# Patient Record
Sex: Male | Born: 1950 | Race: White | Hispanic: No | Marital: Married | Smoking: Never smoker
Health system: Southern US, Community
[De-identification: ages and names within clinical notes are randomized; demographics above are authoritative.]

## PROBLEM LIST (undated history)

## (undated) DIAGNOSIS — K311 Adult hypertrophic pyloric stenosis: Secondary | ICD-10-CM

## (undated) DIAGNOSIS — Z8719 Personal history of other diseases of the digestive system: Secondary | ICD-10-CM

## (undated) DIAGNOSIS — G43909 Migraine, unspecified, not intractable, without status migrainosus: Secondary | ICD-10-CM

## (undated) DIAGNOSIS — K219 Gastro-esophageal reflux disease without esophagitis: Secondary | ICD-10-CM

## (undated) DIAGNOSIS — N400 Enlarged prostate without lower urinary tract symptoms: Secondary | ICD-10-CM

## (undated) DIAGNOSIS — Z8711 Personal history of peptic ulcer disease: Secondary | ICD-10-CM

## (undated) HISTORY — PX: RECTAL SURGERY: SHX760

## (undated) HISTORY — PX: OTHER SURGICAL HISTORY: SHX169

## (undated) HISTORY — PX: TONSILLECTOMY: SUR1361

## (undated) HISTORY — PX: COLONOSCOPY: SHX174

## (undated) HISTORY — DX: Gastro-esophageal reflux disease without esophagitis: K21.9

## (undated) HISTORY — DX: Migraine, unspecified, not intractable, without status migrainosus: G43.909

## (undated) HISTORY — PX: BACK SURGERY: SHX140

## (undated) HISTORY — DX: Benign prostatic hyperplasia without lower urinary tract symptoms: N40.0

---

## 2016-04-01 ENCOUNTER — Encounter (INDEPENDENT_AMBULATORY_CARE_PROVIDER_SITE_OTHER): Payer: Self-pay | Admitting: *Deleted

## 2016-04-22 ENCOUNTER — Ambulatory Visit (INDEPENDENT_AMBULATORY_CARE_PROVIDER_SITE_OTHER): Payer: Medicare Other | Admitting: Internal Medicine

## 2016-04-22 ENCOUNTER — Encounter (INDEPENDENT_AMBULATORY_CARE_PROVIDER_SITE_OTHER): Payer: Self-pay | Admitting: Internal Medicine

## 2016-04-22 ENCOUNTER — Other Ambulatory Visit (INDEPENDENT_AMBULATORY_CARE_PROVIDER_SITE_OTHER): Payer: Self-pay | Admitting: Internal Medicine

## 2016-04-22 ENCOUNTER — Other Ambulatory Visit (INDEPENDENT_AMBULATORY_CARE_PROVIDER_SITE_OTHER): Payer: Self-pay | Admitting: *Deleted

## 2016-04-22 ENCOUNTER — Encounter (INDEPENDENT_AMBULATORY_CARE_PROVIDER_SITE_OTHER): Payer: Self-pay | Admitting: *Deleted

## 2016-04-22 ENCOUNTER — Encounter (INDEPENDENT_AMBULATORY_CARE_PROVIDER_SITE_OTHER): Payer: Self-pay

## 2016-04-22 VITALS — BP 90/58 | HR 60 | Temp 98.0°F | Ht 69.0 in | Wt 133.0 lb

## 2016-04-22 DIAGNOSIS — R110 Nausea: Secondary | ICD-10-CM | POA: Diagnosis not present

## 2016-04-22 DIAGNOSIS — N400 Enlarged prostate without lower urinary tract symptoms: Secondary | ICD-10-CM

## 2016-04-22 DIAGNOSIS — K6289 Other specified diseases of anus and rectum: Secondary | ICD-10-CM | POA: Diagnosis not present

## 2016-04-22 DIAGNOSIS — G43909 Migraine, unspecified, not intractable, without status migrainosus: Secondary | ICD-10-CM | POA: Insufficient documentation

## 2016-04-22 DIAGNOSIS — K219 Gastro-esophageal reflux disease without esophagitis: Secondary | ICD-10-CM | POA: Insufficient documentation

## 2016-04-22 MED ORDER — HYDROCORTISONE ACE-PRAMOXINE 1-1 % RE FOAM: 1.0000 | Freq: Two times a day (BID) | RECTAL | Status: DC

## 2016-04-22 MED ORDER — PEG 3350-KCL-NA BICARB-NACL 420 G PO SOLR: 4000.0000 mL | Freq: Once | ORAL | Status: DC

## 2016-04-22 NOTE — Progress Notes (Addendum)
Subjective:    Patient ID: Joel Richards, male    DOB: 1951-09-21, 65 y.o.   MRN: [id number]  HPI Referred by Dr. Kathi LudwigWater's for nausea. He states he has a lot of nausea and heartburn. He says he has extreme pain in his rectum after he has a BM (30-40 minutes) after having a BM. He has diarrhea like symptoms. If he takes an Imodium he will become constipated.  Sometimes he has the urge to have a BM but he doesn't have a BM . He takes Mineral Oil for constipation as needed. Symptoms x 6 months. He says the Nexium is helping his acid reflux. He says he has nausea all the time.  The nausea is not new but is more severe.  His appetite is not good. He has lost 15 pounds since the first of the year. He says he is not eating.   He underwent a HIDA scan 04/29/2016 for nausea which was normal.  He c/o bloating.  Last colonoscopy 7-8 yrs ago and he reports no polyps.  Patient requesting an EGD.    08/03/2013 Gastrin serum 42.(normal)  06/21/2009 EGD: by: astric mucosa with small focus of stratified squamous mucosa of distal esophagus. No Barrett's.  07/31/2013 EGD:  (persistent nausea and upper abdominal pain with a crampy nature). ulceraton with gastritis, ulceration and inflammation duodenum, moderate esophagitis and cardial gastritis. Review of Systems .     Past Medical History  Diagnosis Date  . Migraines   . Enlarged prostate   . GERD (gastroesophageal reflux disease)     Past Surgical History  Procedure Laterality Date  . Back surgery      x 2   . 5 rt shoulder  surgery    . Rt shoulder replacement      No Known Allergies  No current outpatient prescriptions on file prior to visit.   No current facility-administered medications on file prior to visit.   Current Outpatient Prescriptions  Medication Sig Dispense Refill  . alum & mag hydroxide-simeth (MAALOX/MYLANTA) 200-200-20 MG/5ML suspension Take by mouth every 6 (six) hours as needed for indigestion or heartburn.    .  diphenhydrAMINE (SOMINEX) 25 MG tablet Take 25 mg by mouth at bedtime as needed for sleep.    Marland Kitchen. esomeprazole (NEXIUM) 40 MG capsule Take 40 mg by mouth daily at 12 noon.    . fluticasone (FLONASE) 50 MCG/ACT nasal spray Place into both nostrils daily.    . hyoscyamine (LEVSIN, ANASPAZ) 0.125 MG tablet Take 0.125 mg by mouth every 4 (four) hours as needed.    . loperamide (IMODIUM) 2 MG capsule Take by mouth as needed for diarrhea or loose stools.    . mineral oil liquid Take 15 mLs by mouth 2 (two) times daily before a meal.    . propranolol (INDERAL) 80 MG tablet Take 80 mg by mouth 3 (three) times daily.    . sildenafil (REVATIO) 20 MG tablet Take 20 mg by mouth 3 (three) times daily.    . simethicone (MYLICON) 125 MG chewable tablet Chew 125 mg by mouth every 6 (six) hours as needed for flatulence.    . sucralfate (CARAFATE) 1 GM/10ML suspension Take 1 g by mouth 4 (four) times daily -  with meals and at bedtime.    . SUMAtriptan (IMITREX) 100 MG tablet Take 100 mg by mouth every 2 (two) hours as needed for migraine. May repeat in 2 hours if headache persists or recurs.    . SUMAtriptan (IMITREX) 6  MG/0.5ML SOLN injection Inject 6 mg into the skin every 2 (two) hours as needed for migraine or headache. May repeat in 2 hours if headache persists or recurs.    . tamsulosin (FLOMAX) 0.4 MG CAPS capsule Take 0.4 mg by mouth.    . zolpidem (AMBIEN CR) 12.5 MG CR tablet Take 12.5 mg by mouth at bedtime as needed for sleep.    . hydrocortisone-pramoxine (PROCTOFOAM HC) rectal foam Place 1 applicator rectally 2 (two) times daily. 10 g 0  . polyethylene glycol-electrolytes (NULYTELY/GOLYTELY) 420 g solution Take 4,000 mLs by mouth once. 4000 mL 0   No current facility-administered medications for this visit.        Objective:   Physical Exam Blood pressure 90/58, pulse 60, temperature 98 F (36.7 C), height  (1.753 m), weight 133 lb (60.328 kg). Alert and oriented. Skin warm and dry. Oral  mucosa is moist.   . Sclera anicteric, conjunctivae is pink. Thyroid not enlarged. No cervical lymphadenopathy. Lungs clear. Heart regular rate and rhythm.  Abdomen is soft. Bowel sounds are positive. No hepatomegaly. No abdominal masses felt. No tenderness.  No edema to lower extremities.          Assessment & Plan:    Rectal pain  Am going to try him on Proctofoam. I thinks he needs a colonoscopy to rule a colonic neoplasm Nausea:  Will get an EGD. PUD needs to be ruled out.

## 2016-04-22 NOTE — Telephone Encounter (Signed)
Patient needs trilyte 

## 2016-04-22 NOTE — Patient Instructions (Signed)
The risks and benefits such as perforation, bleeding, and infection were reviewed with the patient and is agreeable. 

## 2016-05-03 ENCOUNTER — Telehealth (INDEPENDENT_AMBULATORY_CARE_PROVIDER_SITE_OTHER): Payer: Self-pay | Admitting: Internal Medicine

## 2016-05-03 NOTE — Telephone Encounter (Signed)
Patient called, in a lot of pain with his hemorrhoids.  His colonoscopy is scheduled for 06/30/16.  Patient would like to talk to you to see if you have any suggestions for who he can see for the hemorrhoids and/or what to do for this pain until his colonoscopy.  [phone]

## 2016-05-04 ENCOUNTER — Telehealth (INDEPENDENT_AMBULATORY_CARE_PROVIDER_SITE_OTHER): Payer: Self-pay | Admitting: Internal Medicine

## 2016-05-04 DIAGNOSIS — K6289 Other specified diseases of anus and rectum: Secondary | ICD-10-CM

## 2016-05-04 MED ORDER — HYDROCORTISONE 2.5 % RE CREA: 1.0000 "application " | TOPICAL_CREAM | Freq: Two times a day (BID) | RECTAL | Status: DC

## 2016-05-04 NOTE — Telephone Encounter (Signed)
Rx for Anusol sent to his pharmacy 

## 2016-05-04 NOTE — Telephone Encounter (Signed)
Rx for Anusol sent to his pharmacy

## 2016-05-11 ENCOUNTER — Encounter (INDEPENDENT_AMBULATORY_CARE_PROVIDER_SITE_OTHER): Payer: Self-pay

## 2016-06-23 ENCOUNTER — Telehealth (INDEPENDENT_AMBULATORY_CARE_PROVIDER_SITE_OTHER): Payer: Self-pay | Admitting: *Deleted

## 2016-06-23 NOTE — Telephone Encounter (Signed)
I would definitely reschedule colonoscopy.

## 2016-06-23 NOTE — Telephone Encounter (Signed)
Patient is sch'd for TCS/EGD on 06/30/16 -- he had fissure repair on 6/16, area is still tender, he wants to know if he needs to resch TCS -- he still wants to go through with EGD either way -- please advise

## 2016-06-25 NOTE — Telephone Encounter (Signed)
Patient aware that we need to resch'd TCS, he states he will call us back to get TCS back on schedule

## 2016-06-30 ENCOUNTER — Encounter (HOSPITAL_COMMUNITY): Payer: Self-pay | Admitting: *Deleted

## 2016-06-30 ENCOUNTER — Ambulatory Visit (HOSPITAL_COMMUNITY)
Admission: RE | Admit: 2016-06-30 | Discharge: 2016-06-30 | Disposition: A | Discharge status: Home / Self Care | Payer: Medicare Other | Source: Ambulatory Visit | Attending: Internal Medicine | Admitting: Internal Medicine

## 2016-06-30 ENCOUNTER — Encounter (INDEPENDENT_AMBULATORY_CARE_PROVIDER_SITE_OTHER): Payer: Self-pay | Admitting: Internal Medicine

## 2016-06-30 ENCOUNTER — Encounter (HOSPITAL_COMMUNITY)
Admission: RE | Disposition: A | Discharge status: Home / Self Care | Payer: Self-pay | Source: Ambulatory Visit | Attending: Internal Medicine

## 2016-06-30 DIAGNOSIS — K6289 Other specified diseases of anus and rectum: Secondary | ICD-10-CM

## 2016-06-30 DIAGNOSIS — Z681 Body mass index (BMI) 19 or less, adult: Secondary | ICD-10-CM | POA: Diagnosis not present

## 2016-06-30 DIAGNOSIS — Z96611 Presence of right artificial shoulder joint: Secondary | ICD-10-CM | POA: Insufficient documentation

## 2016-06-30 DIAGNOSIS — K319 Disease of stomach and duodenum, unspecified: Secondary | ICD-10-CM | POA: Diagnosis not present

## 2016-06-30 DIAGNOSIS — R634 Abnormal weight loss: Secondary | ICD-10-CM | POA: Insufficient documentation

## 2016-06-30 DIAGNOSIS — R1013 Epigastric pain: Secondary | ICD-10-CM | POA: Insufficient documentation

## 2016-06-30 DIAGNOSIS — K2970 Gastritis, unspecified, without bleeding: Secondary | ICD-10-CM | POA: Insufficient documentation

## 2016-06-30 DIAGNOSIS — K259 Gastric ulcer, unspecified as acute or chronic, without hemorrhage or perforation: Secondary | ICD-10-CM | POA: Insufficient documentation

## 2016-06-30 DIAGNOSIS — K315 Obstruction of duodenum: Secondary | ICD-10-CM | POA: Insufficient documentation

## 2016-06-30 DIAGNOSIS — Z79899 Other long term (current) drug therapy: Secondary | ICD-10-CM | POA: Insufficient documentation

## 2016-06-30 DIAGNOSIS — R110 Nausea: Secondary | ICD-10-CM | POA: Diagnosis not present

## 2016-06-30 DIAGNOSIS — K311 Adult hypertrophic pyloric stenosis: Secondary | ICD-10-CM | POA: Diagnosis not present

## 2016-06-30 DIAGNOSIS — K269 Duodenal ulcer, unspecified as acute or chronic, without hemorrhage or perforation: Secondary | ICD-10-CM | POA: Insufficient documentation

## 2016-06-30 DIAGNOSIS — Z7951 Long term (current) use of inhaled steroids: Secondary | ICD-10-CM | POA: Diagnosis not present

## 2016-06-30 DIAGNOSIS — K219 Gastro-esophageal reflux disease without esophagitis: Secondary | ICD-10-CM

## 2016-06-30 DIAGNOSIS — K2960 Other gastritis without bleeding: Secondary | ICD-10-CM | POA: Diagnosis not present

## 2016-06-30 HISTORY — PX: ESOPHAGOGASTRODUODENOSCOPY: SHX5428

## 2016-06-30 LAB — COMPREHENSIVE METABOLIC PANEL
ALK PHOS: 58 U/L (ref 38–126)
ALT: 10 U/L — AB (ref 17–63)
AST: 12 U/L — ABNORMAL LOW (ref 15–41)
Albumin: 2 g/dL — ABNORMAL LOW (ref 3.5–5.0)
Anion gap: 3 — ABNORMAL LOW (ref 5–15)
BUN: 15 mg/dL (ref 6–20)
CALCIUM: 7.4 mg/dL — AB (ref 8.9–10.3)
CO2: 26 mmol/L (ref 22–32)
CREATININE: 0.93 mg/dL (ref 0.61–1.24)
Chloride: 111 mmol/L (ref 101–111)
GFR calc non Af Amer: >60 mL/min (ref >60)
GLUCOSE: 83 mg/dL (ref 65–99)
Potassium: 3.6 mmol/L (ref 3.5–5.1)
SODIUM: 140 mmol/L (ref 135–145)
Total Bilirubin: 0.4 mg/dL (ref 0.3–1.2)
Total Protein: 4.2 g/dL — ABNORMAL LOW (ref 6.5–8.1)

## 2016-06-30 LAB — CBC
HEMATOCRIT: 34.9 % — AB (ref 39.0–52.0)
Hemoglobin: 11.2 g/dL — ABNORMAL LOW (ref 13.0–17.0)
MCH: 29.5 pg (ref 26.0–34.0)
MCHC: 32.1 g/dL (ref 30.0–36.0)
MCV: 91.8 fL (ref 78.0–100.0)
Platelets: 191 10*3/uL (ref 150–400)
RBC: 3.8 MIL/uL — AB (ref 4.22–5.81)
RDW: 13.6 % (ref 11.5–15.5)
WBC: 3.3 10*3/uL — AB (ref 4.0–10.5)

## 2016-06-30 SURGERY — EGD (ESOPHAGOGASTRODUODENOSCOPY)
Anesthesia: Moderate Sedation

## 2016-06-30 MED ORDER — MIDAZOLAM HCL 5 MG/5ML IJ SOLN
INTRAMUSCULAR | Status: AC
  Filled 2016-06-30: qty 10

## 2016-06-30 MED ORDER — PROMETHAZINE HCL 25 MG/ML IJ SOLN
INTRAMUSCULAR | Status: DC | PRN
  Administered 2016-06-30: 12.5 mg via INTRAVENOUS

## 2016-06-30 MED ORDER — STERILE WATER FOR IRRIGATION IR SOLN
Status: DC | PRN
  Administered 2016-06-30: 2.5 mL

## 2016-06-30 MED ORDER — MIDAZOLAM HCL 5 MG/5ML IJ SOLN
INTRAMUSCULAR | Status: DC | PRN
  Administered 2016-06-30 (×3): 2 mg via INTRAVENOUS

## 2016-06-30 MED ORDER — SODIUM CHLORIDE 0.9 % IV SOLN
INTRAVENOUS | Status: DC
  Administered 2016-06-30: 13:00:00 via INTRAVENOUS

## 2016-06-30 MED ORDER — MEPERIDINE HCL 50 MG/ML IJ SOLN
INTRAMUSCULAR | Status: AC
  Filled 2016-06-30: qty 1

## 2016-06-30 MED ORDER — PROMETHAZINE HCL 25 MG/ML IJ SOLN
INTRAMUSCULAR | Status: AC
  Filled 2016-06-30: qty 1

## 2016-06-30 MED ORDER — BUTAMBEN-TETRACAINE-BENZOCAINE 2-2-14 % EX AERO
INHALATION_SPRAY | CUTANEOUS | Status: DC | PRN
  Administered 2016-06-30: 2 via TOPICAL

## 2016-06-30 MED ORDER — SODIUM CHLORIDE 0.9% FLUSH
INTRAVENOUS | Status: AC
  Filled 2016-06-30: qty 10

## 2016-06-30 MED ORDER — MEPERIDINE HCL 50 MG/ML IJ SOLN
INTRAMUSCULAR | Status: DC | PRN
  Administered 2016-06-30 (×2): 25 mg via INTRAVENOUS

## 2016-06-30 NOTE — Op Note (Signed)
[facility] Patient Name: Joel Richards Procedure Date: 06/30/2016 12:31 PM MRN: [id number] Date of Birth: 1951/07/22 Attending MD: Lionel December , MD CSN: [id number] Age: 65 Admit Type: Outpatient Procedure:                Upper GI endoscopy Indications:              Epigastric abdominal pain, Nausea, Weight loss Providers:                Lionel December, MD, Nena Polio, RN, Burke Keels,                            Technician Referring MD:             Venetia Night. Waters, MD Medicines:                Cetacaine spray, Promethazine 12.5 mg IV,                            Meperidine 50 mg IV, Midazolam 6 mg IV Complications:            No immediate complications. Estimated Blood Loss:     Estimated blood loss was minimal. Procedure:                Pre-Anesthesia Assessment:                           - Prior to the procedure, a History and Physical                            was performed, and patient medications and                            allergies were reviewed. The patient's tolerance of                            previous anesthesia was also reviewed. The risks                            and benefits of the procedure and the sedation                            options and risks were discussed with the patient.                            All questions were answered, and informed consent                            was obtained. Prior Anticoagulants: The patient has                            taken no previous anticoagulant or antiplatelet                            agents. ASA Grade Assessment: II - A patient with  mild systemic disease. After reviewing the risks                            and benefits, the patient was deemed in                            satisfactory condition to undergo the procedure.                           After obtaining informed consent, the endoscope was                            passed under direct vision. Throughout the                             procedure, the patient's blood pressure, pulse, and                            oxygen saturations were monitored continuously. The                            EG-[id number] ([id number]) scope was introduced through the                            mouth, and advanced to the second part of duodenum.                            The upper GI endoscopy was accomplished without                            difficulty. The patient tolerated the procedure                            well. Scope In: 1:15:26 PM Scope Out: 1:32:48 PM Total Procedure Duration: 0 hours 17 minutes 22 seconds  Findings:      The examined esophagus was normal.      The Z-line was regular and was found 42 cm from the incisors.      Clear fluid was found in the stomach.      A small amount of food (residue) was found in the gastric body.      Diffuse minimal inflammation characterized by congestion (edema) and       erythema was found in the gastric fundus. Biopsies were taken with a       cold forceps for histology.      One non-bleeding superficial gastric ulcer with no stigmata of bleeding       was found in the gastric antrum. The lesion was 6 mm in largest       dimension.      One non-bleeding cratered ircumferential gastric ulcer with no stigmata       of bleeding was found in the prepyloric region of the stomach and at the       pylorus. The lesion was 20 mm in largest dimension. Biopsies were taken       with a cold forceps for histology.      A  benign-appearing, intrinsic moderate stenosis was found at the       pylorus. This was traversed.      One non-bleeding superficial duodenal ulcer was found in the duodenal       bulb. The lesion was 8 mm in largest dimension.      An acquired benign-appearing, intrinsic moderate stenosis was found in       the duodenal bulb and was traversed.      The second portion of the duodenum was normal. Biopsies were taken with       a cold forceps for  histology. Impression:               - Normal esophagus.                           - Z-line regular, 42 cm from the incisors.                           - Clear gastric fluid.                           - A small amount of food (residue) in the stomach.                           - Gastritis. Biopsied.                           - Non-bleeding gastric ulcer at antrum with no                            stigmata of bleeding.                           - Large circumferential non-bleeding                            prepyloric/pyloric channel ulcer . Biopsied.                           - Gastric stenosis was found at the pylorus.                           - One non-bleeding duodenal ulcer with acquired                            duodenal stenosis.                           - Normal second portion of the duodenum. Biopsied. Moderate Sedation:      Moderate (conscious) sedation was administered by the endoscopy nurse       and supervised by the endoscopist. The following parameters were       monitored: oxygen saturation, heart rate, blood pressure, CO2       capnography and response to care. Total physician intraservice time was       23 minutes. Recommendation:           - Patient has a contact number available for  emergencies. The signs and symptoms of potential                            delayed complications were discussed with the                            patient. Return to normal activities tomorrow.                            Written discharge instructions were provided to the                            patient.                           - Mechanical soft diet today.                           - Continue present medications.                           - No aspirin, ibuprofen, naproxen, or other                            non-steroidal anti-inflammatory drugs.                           - Await pathology results.                           - Return to GI clinic in  2 weeks. Procedure Code(s):        --- Professional ---                           [phone], Esophagogastroduodenoscopy, flexible,                            transoral; with biopsy, single or multiple                           99152, Moderate sedation services provided by the                            same physician or other qualified health care                            professional performing the diagnostic or                            therapeutic service that the sedation supports,                            requiring the presence of an independent trained                            observer to assist in the monitoring of the  patient's level of consciousness and physiological                            status; initial 15 minutes of intraservice time,                            patient age 53 years or older                           99153, Moderate sedation [phone]; each additional                            15 minutes intraservice time Diagnosis Code(s):        --- Professional ---                           K29.70, Gastritis, unspecified, without bleeding                           K25.9, Gastric ulcer, unspecified as acute or                            chronic, without hemorrhage or perforation                           K31.1, Adult hypertrophic pyloric stenosis                           K26.9, Duodenal ulcer, unspecified as acute or                            chronic, without hemorrhage or perforation                           K31.5, Obstruction of duodenum                           R10.13, Epigastric pain                           R11.0, Nausea                           R63.4, Abnormal weight loss CPT copyright 2016 [employer]. All rights reserved. The codes documented in this report are preliminary and upon coder review may  be revised to meet current compliance requirements. Lionel DecemberNajeeb Coben Godshall, MD Lionel DecemberNajeeb Rylan Kaufmann, MD 06/30/2016 1:52:33  PM This report has been signed electronically. Number of Addenda: 0

## 2016-06-30 NOTE — H&P (Signed)
Joel BasquesRaymond Richards is an 65 y.o. male.   Chief Complaint: Patient is therefore EGD. HPI: This 65 year old Caucasian male was with one-year history of peptic ulcer disease for unknown reasons was doing well and December 2016 when he began to have epigastric pain nausea began to lose weight. [facility] but in the meantime he developed fissure and had to have surgery. He has been on Nexium all along. [facility] [place] [facility] revealing active disease. Gastrin level in the passes been normal and H. pylori serologies been negative. Patient does not take OTC NSAIDs. He does not smoke cigarettes or drink alcohol. Family history is negative for peptic ulcer disease. He states he lost 30 pounds over the last few weeks he's felt better and has gained 4 pounds back. No history of melena or rectal bleeding.  Past Medical History  Diagnosis Date  . Migraines   . Enlarged prostate   . GERD (gastroesophageal reflux disease)     Past Surgical History  Procedure Laterality Date  . Back surgery      x 2   . 5 rt shoulder  surgery    . Rt shoulder replacement    . Rectal surgery      fissure    History reviewed. No pertinent family history. Social History:  reports that he has never smoked. He does not have any smokeless tobacco history on file. He reports that he does not drink alcohol or use illicit drugs.  Allergies: No Known Allergies  Medications Prior to Admission  Medication Sig Dispense Refill  . alum & mag hydroxide-simeth (MAALOX/MYLANTA) 200-200-20 MG/5ML suspension Take by mouth every 6 (six) hours as needed for indigestion or heartburn.    . diphenhydrAMINE (SOMINEX) 25 MG tablet Take 25 mg by mouth at bedtime as needed for sleep.    Marland Kitchen. esomeprazole (NEXIUM) 40 MG capsule Take 40 mg by mouth 2 (two) times daily before a meal.     . propranolol (INDERAL) 80 MG tablet Take 80 mg by mouth daily.     . sildenafil  (REVATIO) 20 MG tablet Take 20 mg by mouth 3 (three) times daily.    . SUMAtriptan (IMITREX) 100 MG tablet Take 100 mg by mouth every 2 (two) hours as needed for migraine. May repeat in 2 hours if headache persists or recurs.    . SUMAtriptan (IMITREX) 6 MG/0.5ML SOLN injection Inject 6 mg into the skin every 2 (two) hours as needed for migraine or headache. May repeat in 2 hours if headache persists or recurs.    . tamsulosin (FLOMAX) 0.4 MG CAPS capsule Take 0.4 mg by mouth.    . zolpidem (AMBIEN CR) 12.5 MG CR tablet Take 12.5 mg by mouth at bedtime as needed for sleep.    . fluticasone (FLONASE) 50 MCG/ACT nasal spray Place into both nostrils daily.    . hydrocortisone (ANUSOL-HC) 2.5 % rectal cream Place 1 application rectally 2 (two) times daily. 30 g 1  . hydrocortisone-pramoxine (PROCTOFOAM HC) rectal foam Place 1 applicator rectally 2 (two) times daily. 10 g 0  . hyoscyamine (LEVSIN, ANASPAZ) 0.125 MG tablet Take 0.125 mg by mouth every 4 (four) hours as needed.    . loperamide (IMODIUM) 2 MG capsule Take by mouth as needed for diarrhea or loose stools.    . mineral oil liquid Take 15 mLs by mouth 2 (two) times daily before a meal.    .  polyethylene glycol-electrolytes (NULYTELY/GOLYTELY) 420 g solution Take 4,000 mLs by mouth once. 4000 mL 0  . simethicone (MYLICON) 125 MG chewable tablet Chew 125 mg by mouth every 6 (six) hours as needed for flatulence.    . sucralfate (CARAFATE) 1 GM/10ML suspension Take 1 g by mouth 4 (four) times daily -  with meals and at bedtime.      No results found for this or any previous visit (from the past 48 hour(s)). No results found.  ROS  Blood pressure 126/92, pulse 68, temperature 97.5 F (36.4 C), temperature source Oral, resp. rate 9, height  (1.753 m), weight 123 lb (55.792 kg), SpO2 100 %. Physical Exam  Constitutional:  Well-developed thin Caucasian male in NAD.  HENT:  Mouth/Throat: Oropharynx is clear and moist.  Eyes: Conjunctivae  are normal. No scleral icterus.  Neck: No thyromegaly present.  Cardiovascular: Normal rate, regular rhythm and normal heart sounds.   No murmur heard. Respiratory: Effort normal and breath sounds normal.  GI:  Abdomen is flat and soft with mild midepigastric tenderness. No organomegaly or masses.  Musculoskeletal: He exhibits edema (plus pitting edema noted around both ankles. Slightly more on the rig side. No calf tenderness noted.).  Lymphadenopathy:    He has no cervical adenopathy.     Assessment/Plan Epigastric pain nausea and weight loss in a patient with chronic peptic ulcer disease. Diagnostic EGD.  Lionel December, MD 06/30/2016, 1:03 PM

## 2016-06-30 NOTE — Discharge Instructions (Signed)
Resume usual medications. Mechanical soft diet or pureed diet. No driving for 24 hours. Physician will call with biopsy results. Office visit in 2 weeks. Office will call.   Dysphagia Diet Level 1, Pureed The dysphasia level 1 diet includes foods that are completely pureed and smooth. The foods have a pudding-like texture, such as the texture of pureed pancakes, mashed potatoes, and yogurt. The diet does not include foods with lumps or coarse textures. Liquids should be smooth and may either be thin, nectar-thick, honey-like, or spoon-thick. This diet is helpful for people with moderate to severe swallowing problems. It reduces the risk of food getting caught in the windpipe, trachea, or lungs. You may need help or supervision during meals while following this diet. WHAT DO I NEED TO KNOW ABOUT THIS DIET? Foods  You may eat foods that are soft and have a pudding-like texture. If a food does not have this texture, you may be able to eat the food after:  Pureeing it. This can be done with a blender or whisk.  Moistening it with liquid. For example, you may have bread if you soak it in milk or syrup.  Avoid foods that are hard, dry, sticky, chunky, lumpy, or stringy. Also avoid foods with nuts, seeds, raisins, skins, and pulp.  Do not eat foods that you have to chew. If you have to chew the food, then you cannot eat it.  Eat a variety of foods to get all the nutrients you need. Liquids  You may drink liquids that are smooth. Your health care provider will tell you if you should drink thin or thickened liquids.  To thicken a liquid, use a food and beverage thickener or a thickening food. Thickened liquids are usually a "pudding-like" consistency.  Thin liquids include fruit juices, milk, coffee, tea, yogurts, shakes, and similar foods that melt to thin liquid at room temperature.  Avoid liquids with seeds, pulp, or chunks. See your dietitian or health care provider regularly for help  with your dietary changes. WHAT FOODS CAN I EAT? Grains Store-bought soft breads, pancakes, and [place] toast that have a smooth, moist texture and do not have nuts or seeds (you will need to moisten the food with liquid). Cooked cereals that have a pudding-like consistency, such as cream of wheat or farina (no oatmeal). Pureed, well-cooked pasta, rice, and plain bread stuffing. Vegetables Pureed vegetables. Soft avocado. Smooth tomato paste or sauce. Strained or pureed soups (these may need to be thickened as directed). Mashed or pureed potatoes without skin (can be seasoned with butter, smooth gravy, margarine, or sour cream). Fruits Pureed fruits such as melons and apples without seeds or pulp. Mashed bananas. Smooth tomato paste or sauce. Fruit juices without pulp or seeds. Strained or pureed soups. Meat and Other Protein Sources Pureed meat. Smooth pate or liverwurst. Smooth souffles. Pureed beans (such as lentils). Pureed eggs. Dairy Yogurt. Smooth cheese sauces. Milk (may need to be thickened). Nutritional dairy drinks or shakes. Ask your health care provider whether you can have ice cream. Condiments Finely ground salt, pepper, and other ground spices. Sweets/Desserts Smooth puddings and custards. Pureed desserts. Souffles. Whipped topping. Ask your health care provider whether you can have frozen desserts. Fats and Oils Butter. Margarine. Smooth and strained gravy. Sour cream. Mayonnaise. Cream cheese. Whipped topping. Smooth sauces (such as white sauce, cheese sauce, or hollandaise sauce). The items listed above may not be a complete list of recommended foods or beverages. Contact your dietitian for more options. WHAT FOODS  ARE NOT RECOMMENDED? Grains Oatmeal. Dry cereals. Hard breads. Vegetables Whole vegetables. Stringy vegetables (such as celery). Thin tomato sauce. Fruits Whole fresh, frozen, canned, or dried fruits that have not been pureed. Stringy fruits (such as  pineapple). Meat and Other Protein Sources Whole or ground meat, fish, or poultry. Dried or cooked lentils or legumes that have been cooked but not mashed or pureed. Non-pureed eggs. Nuts and seeds. Peanut butter. Dairy Non-pureed cheese. Dairy products with lumps or chunks. Ask your health care provider whether you can have ice cream. Condiments Coarse or seeded herbs and spices. Sweets/Desserts [place] preserves. Jams with seeds. Solid desserts. Sticky, chewy sweets (such as licorice and caramel). Ask your health care provider whether you can have frozen desserts. Fats and Oils Sauces of fats with lumps or chunks. The items listed above may not be a complete list of foods and beverages to avoid. Contact your dietitian for more information.   This information is not intended to replace advice given to you by your health care provider. Make sure you discuss any questions you have with your health care provider.   Document Released: 11/29/2005 Document Revised: 12/20/2014 Document Reviewed: 11/12/2013 Elsevier Interactive Patient Education 2016 Elsevier Inc. Soft-Food Meal Plan A soft-food meal plan includes foods that are safe and easy to swallow. This meal plan typically is used:  If you are having trouble chewing or swallowing foods.  As a transition meal plan after only having had liquid meals for a long period. WHAT DO I NEED TO KNOW ABOUT THE SOFT-FOOD MEAL PLAN? A soft-food meal plan includes tender foods that are soft and easy to chew and swallow. In most cases, bite-sized pieces of food are easier to swallow. A bite-sized piece is about  inch or smaller. Foods in this plan do not need to be ground or pureed. Foods that are very hard, crunchy, or sticky should be avoided. Also, breads, cereals, yogurts, and desserts with nuts, seeds, or fruits should be avoided. WHAT FOODS CAN I EAT? Grains Rice and wild rice. Moist bread, dressing, pasta, and noodles. Well-moistened dry or cooked  cereals, such as farina (cooked wheat cereal), oatmeal, or grits. Biscuits, breads, muffins, pancakes, and waffles that have been well moistened. Vegetables Shredded lettuce. Cooked, tender vegetables, including potatoes without skins. Vegetable juices. Broths or creamed soups made with vegetables that are not stringy or chewy. Strained tomatoes (without seeds). Fruits Canned or well-cooked fruits. Soft (ripe), peeled fresh fruits, such as peaches, nectarines, kiwi, cantaloupe, honeydew melon, and watermelon (without seeds). Soft berries with small seeds, such as strawberries. Fruit juices (without pulp). Meats and Other Protein Sources Moist, tender, lean beef. Mutton. Lamb. Veal. Chicken. [place]. Liver. Ham. Fish without bones. Eggs. Dairy Milk, milk drinks, and cream. Plain cream cheese and cottage cheese. Plain yogurt. Sweets/Desserts Flavored gelatin desserts. Custard. Plain ice cream, frozen yogurt, sherbet, milk shakes, and malts. Plain cakes and cookies. Plain hard candy.  Other Butter, margarine (without trans fat), and cooking oils. Mayonnaise. Cream sauces. Mild spices, salt, and sugar. Syrup, molasses, honey, and jelly. The items listed above may not be a complete list of recommended foods or beverages. Contact your dietitian for more options. WHAT FOODS ARE NOT RECOMMENDED? Grains Dry bread, toast, crackers that have not been moistened. Coarse or dry cereals, such as bran, granola, and shredded wheat. Tough or chewy crusty breads, such as [place] bread or baguettes. Vegetables Corn. Raw vegetables except shredded lettuce. Cooked vegetables that are tough or stringy. Tough, crisp, fried potatoes  and potato skins. Fruits Fresh fruits with skins or seeds or both, such as apples, pears, or grapes. Stringy, high-pulp fruits, such as papaya, pineapple, coconut, or mango. Fruit leather, fruit roll-ups, and all dried fruits. Meats and Other Protein Sources Sausages and hot dogs. Meats with  gristle. Fish with bones. Nuts, seeds, and chunky peanut or other nut butters. Sweets/Desserts Cakes or cookies that are very dry or chewy.  The items listed above may not be a complete list of foods and beverages to avoid. Contact your dietitian for more information.   This information is not intended to replace advice given to you by your health care provider. Make sure you discuss any questions you have with your health care provider.   Document Released: 03/07/2008 Document Revised: 12/04/2013 Document Reviewed: 10/26/2013 Elsevier Interactive Patient Education 2016 Elsevier Inc. Esophagogastroduodenoscopy, Care After Refer to this sheet in the next few weeks. These instructions provide you with information about caring for yourself after your procedure. Your health care provider may also give you more specific instructions. Your treatment has been planned according to current medical practices, but problems sometimes occur. Call your health care provider if you have any problems or questions after your procedure. WHAT TO EXPECT AFTER THE PROCEDURE After your procedure, it is typical to feel:  Soreness in your throat.  Pain with swallowing.  Sick to your stomach (nauseous).  Bloated.  Dizzy.  Fatigued. HOME CARE INSTRUCTIONS  Do not eat or drink anything until the numbing medicine (local anesthetic) has worn off and your gag reflex has returned. You will know that the local anesthetic has worn off when you can swallow comfortably.  Do not drive or operate machinery until directed by your health care provider.  Take medicines only as directed by your health care provider. SEEK MEDICAL CARE IF:   You cannot stop coughing.  You are not urinating at all or less than usual. SEEK IMMEDIATE MEDICAL CARE IF:  You have difficulty swallowing.  You cannot eat or drink.  You have worsening throat or chest pain.  You have dizziness or lightheadedness or you faint.  You have  nausea or vomiting.  You have chills.  You have a fever.  You have severe abdominal pain.  You have black, tarry, or bloody stools.   This information is not intended to replace advice given to you by your health care provider. Make sure you discuss any questions you have with your health care provider.   Document Released: 11/15/2012 Document Revised: 12/20/2014 Document Reviewed: 11/15/2012 Elsevier Interactive Patient Education [employer]2016 Elsevier Inc.

## 2016-07-05 ENCOUNTER — Other Ambulatory Visit (INDEPENDENT_AMBULATORY_CARE_PROVIDER_SITE_OTHER): Payer: Self-pay | Admitting: Internal Medicine

## 2016-07-05 MED ORDER — ONDANSETRON HCL 4 MG PO TABS: 4.0000 mg | ORAL_TABLET | Freq: Three times a day (TID) | ORAL | 1 refills | Status: DC | PRN

## 2016-07-06 ENCOUNTER — Encounter (HOSPITAL_COMMUNITY): Payer: Self-pay | Admitting: Internal Medicine

## 2016-07-06 NOTE — Progress Notes (Signed)
I called, spoke with the patient's wife.  Gave him an appointment for Thursday, August 3 at 4:00pm and asked that they be here by 3:45pm.

## 2016-07-15 ENCOUNTER — Ambulatory Visit (INDEPENDENT_AMBULATORY_CARE_PROVIDER_SITE_OTHER): Payer: Medicare Other | Admitting: Internal Medicine

## 2016-07-15 ENCOUNTER — Encounter (INDEPENDENT_AMBULATORY_CARE_PROVIDER_SITE_OTHER): Payer: Self-pay | Admitting: Internal Medicine

## 2016-07-15 VITALS — BP 102/70 | HR 63 | Temp 98.1°F | Resp 18 | Ht 69.0 in | Wt 130.9 lb

## 2016-07-15 DIAGNOSIS — E43 Unspecified severe protein-calorie malnutrition: Secondary | ICD-10-CM

## 2016-07-15 DIAGNOSIS — D649 Anemia, unspecified: Secondary | ICD-10-CM

## 2016-07-15 DIAGNOSIS — K279 Peptic ulcer, site unspecified, unspecified as acute or chronic, without hemorrhage or perforation: Secondary | ICD-10-CM

## 2016-07-15 MED ORDER — ESOMEPRAZOLE MAGNESIUM 40 MG PO CPDR
40.0000 mg | DELAYED_RELEASE_CAPSULE | Freq: Three times a day (TID) | ORAL | 3 refills | Status: AC
Start: 2016-07-15 — End: ?

## 2016-07-15 NOTE — Patient Instructions (Addendum)
Esophagogastroduodenoscopy to be scheduled within 4-6 weeks. Please call office with progress report once a week(weight and symptoms). Office visit in 3 months. Can take chewable B complex vitamin daily

## 2016-07-15 NOTE — Progress Notes (Signed)
Presenting complaint;  Follow-up for complicated peptic ulcer disease.  Database and Subjective:  Patient is 65 year old Caucasian male who was initially seen on 04/22/2016 at request by Dr. Vennie Homans for nausea vomiting epigastric pain as well as rectal discomfort and urgency. He has been losing weight. Past history significant for peptic ulcer disease of several years duration. He underwent EGD in June 2010 and again in July 2010. Biopsies revealed benign ulcers. Gastrin level was normal and H. pylori testing was negative. He had another EGD in August 2014 and still had gastric and duodenal ulcers. Biopsy revealed benign etiology. He has been maintained on PPI and apparently was doing better until about 6 or 7 months ago. However he never had a follow-up EGD to confirm healing of these ulcers. 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 Both of these strictures were dilated with the scope. Patient has been maintained on double dose   Esomeprazole and now returns for follow-up visit.  Patient is here for scheduled visit accompanied by his wife. He states ondansetron prescription was not filled by his pharmacist. It needs authorization. He's been checking his weight on home skin regularly. He says he started to feel better and his weight was up 225 pounds and is back down to 120 pounds. He states he's been under a lot of stress. His mother will has been in nursing home. He's is his eating pattern is not as regular. However he states cc eating better than he has been before. He has had few episodes of nausea but no vomiting. He remains with intermittent increased frequency of bowel movements and urgency. He denies melena or rectal bleeding. He does not take OTC NSAIDs. He has lost 3 pounds since his last visit of 04/22/2016. He has noted lower extremity edema. He denies  shortness of breath or chest pain. He does not take OTC NSAIDs.   Current Medications: Outpatient Encounter Prescriptions as of 07/15/2016  Medication Sig  . alum & mag hydroxide-simeth (MAALOX/MYLANTA) 200-200-20 MG/5ML suspension Take by mouth at bedtime as needed for indigestion or heartburn.   . esomeprazole (NEXIUM) 40 MG capsule Take 40 mg by mouth 2 (two) times daily before a meal.   . fluticasone (FLONASE) 50 MCG/ACT nasal spray Place into both nostrils daily as needed.   . loperamide (IMODIUM) 2 MG capsule Take by mouth as needed for diarrhea or loose stools.  . mineral oil liquid Take 15 mLs by mouth at bedtime.   . propranolol (INDERAL) 80 MG tablet Take 80 mg by mouth daily.   . sildenafil (REVATIO) 20 MG tablet Take 20 mg by mouth 3 (three) times daily.  . sucralfate (CARAFATE) 1 GM/10ML suspension Take 1 g by mouth daily as needed.   . SUMAtriptan (IMITREX) 100 MG tablet Take 100 mg by mouth every 2 (two) hours as needed for migraine. May repeat in 2 hours if headache persists or recurs.  . SUMAtriptan (IMITREX) 6 MG/0.5ML SOLN injection Inject 6 mg into the skin every 2 (two) hours as needed for migraine or headache. May repeat in 2 hours if headache persists or recurs.  . tamsulosin (FLOMAX) 0.4 MG CAPS capsule Take 0.4 mg by mouth daily.   Marland Kitchen zolpidem (AMBIEN CR) 12.5 MG CR tablet Take 12.5 mg by mouth at bedtime as needed for sleep.  . [DISCONTINUED] diphenhydrAMINE (SOMINEX) 25 MG tablet Take 25 mg by  mouth at bedtime as needed for sleep.  . [DISCONTINUED] hydrocortisone (ANUSOL-HC) 2.5 % rectal cream Place 1 application rectally 2 (two) times daily. (Patient not taking: Reported on 07/15/2016)  . [DISCONTINUED] hydrocortisone-pramoxine (PROCTOFOAM HC) rectal foam Place 1 applicator rectally 2 (two) times daily.  . [DISCONTINUED] hyoscyamine (LEVSIN, ANASPAZ) 0.125 MG tablet Take 0.125 mg by mouth every 4 (four) hours as needed.  . [DISCONTINUED] ondansetron (ZOFRAN) 4 MG tablet  Take 1 tablet (4 mg total) by mouth every 8 (eight) hours as needed for nausea or vomiting. (Patient not taking: Reported on 07/15/2016)  . [DISCONTINUED] simethicone (MYLICON) 125 MG chewable tablet Chew 125 mg by mouth every 6 (six) hours as needed for flatulence.   No facility-administered encounter medications on file as of 07/15/2016.      Objective: Blood pressure 102/70, pulse 63, temperature 98.1 F (36.7 C), temperature source Oral, resp. rate 18, height 5\' 9"  (1.753 m), weight 130 lb 14.4 oz (59.4 kg).  BMI 19.4. Patient is alert and in no acute distress. He is very thin. Conjunctiva is pink. Sclera is nonicteric Oropharyngeal mucosa is normal. No neck masses or thyromegaly noted. Cardiac exam with regular rhythm normal S1 and S2. No murmur or gallop noted. Lungs are clear to auscultation. Abdomen is flat. Bowel sounds are normal. On palpation abdomen is soft with mild midepigastric tenderness. Succussion splash noted in epigastric region. No organomegaly or masses. He has trace edema around both ankles.  Labs/studies Results: Lab data from 05/26/2016 Serum calcium 7.5 BUN 17 and creatinine 0.95 H&H was 13 and 39.1 with MCV of 92.9.  Lab data from 06/30/2016 WBC 3.3, H&H 11.2 and 34.9, MCV 91.8 and platelet count 191K Serum sodium 140, potassium 3.6, chloride 111, CO2 26, glucose 83, BUN 15, creatinine 0.93  Bilirubin 0.4, AP 58, AST 12, ALT 10, total protein 4.2 and albumin 2.0  Serum calcium 7.4.    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 He appears to be improving but has a long way to go. If he truly has nonhealing peptic ulcer disease he would be a candidate for surgery. Gastrin levels in the past have been normal and H. pylori serology has been negative. Recent gastric biopsy revealed benign ulcer and no evidence of H. pylori and duodenal biopsies were normal.  #2. Anemia  hemoglobin 6 weeks ago was 13 and 2 weeks ago was 11.2. MCV is normal. Anemia appears to be multi-factorial. If H&H does not correct will consider B12 folate levels as well as iron studies.  #3. malnutrition. BMI is 19.4 and serum albumin was 2.0.  #4. Increased stool frequency and urgency most likely secondary to IBS. Will consider colonoscopy when upper GI symptoms have resolved.   Plan:  Increase Esomeprazole to 40 mg by mouth 3 times a day. Few weeks supply of samples given a new prescription issued. EGD with pyloric channel and duodenal stricture dilation in 4-6 weeks. Ration advised to take 1 or 2 chewable B complex tablets  as well as calcium with vitamin D daily. Will check CBC, vitamin D level as well as serum albumin at the time of EGD.

## 2016-07-16 ENCOUNTER — Encounter (INDEPENDENT_AMBULATORY_CARE_PROVIDER_SITE_OTHER): Payer: Self-pay | Admitting: Internal Medicine

## 2016-07-16 ENCOUNTER — Ambulatory Visit (INDEPENDENT_AMBULATORY_CARE_PROVIDER_SITE_OTHER): Payer: Medicare Other | Admitting: Internal Medicine

## 2016-07-20 ENCOUNTER — Encounter (INDEPENDENT_AMBULATORY_CARE_PROVIDER_SITE_OTHER): Payer: Self-pay | Admitting: *Deleted

## 2016-07-20 ENCOUNTER — Other Ambulatory Visit (INDEPENDENT_AMBULATORY_CARE_PROVIDER_SITE_OTHER): Payer: Self-pay | Admitting: Internal Medicine

## 2016-07-20 DIAGNOSIS — K279 Peptic ulcer, site unspecified, unspecified as acute or chronic, without hemorrhage or perforation: Secondary | ICD-10-CM

## 2016-07-20 DIAGNOSIS — D649 Anemia, unspecified: Secondary | ICD-10-CM

## 2016-07-22 ENCOUNTER — Telehealth (INDEPENDENT_AMBULATORY_CARE_PROVIDER_SITE_OTHER): Payer: Self-pay | Admitting: *Deleted

## 2016-07-22 NOTE — Telephone Encounter (Signed)
Patient LMOM with PR -- states over all he feels ok, his weight this morning was 123 and he will check in next week.

## 2016-07-29 ENCOUNTER — Encounter (INDEPENDENT_AMBULATORY_CARE_PROVIDER_SITE_OTHER): Payer: Self-pay

## 2016-07-30 ENCOUNTER — Encounter (INDEPENDENT_AMBULATORY_CARE_PROVIDER_SITE_OTHER): Payer: Self-pay

## 2016-07-30 ENCOUNTER — Telehealth (INDEPENDENT_AMBULATORY_CARE_PROVIDER_SITE_OTHER): Payer: Self-pay | Admitting: *Deleted

## 2016-07-30 NOTE — Telephone Encounter (Signed)
Patient called and states that last week was not a good week. Nausea. His weight is 123 lbs. Today he feels better still some nausea.  A PA IS NEEDED FOR THE ONDANSETRON - will work on 08/02/2016.

## 2016-07-31 NOTE — Telephone Encounter (Signed)
Please try to get ondansetron approved. Dose would be 4 mg 3 times a day when necessary.

## 2016-08-02 ENCOUNTER — Telehealth (INDEPENDENT_AMBULATORY_CARE_PROVIDER_SITE_OTHER): Payer: Self-pay | Admitting: *Deleted

## 2016-08-02 NOTE — Telephone Encounter (Signed)
A PA has been completed. Documented and Pharmacy has been made aware.

## 2016-08-02 NOTE — Telephone Encounter (Signed)
Pa was completed for the Ondansetron HCI 4 mg tablets. They were approved , Case # [id number]. Dates of approval are 05/23/201 - 08/02/2017. Pharmacy was called and made aware.

## 2016-08-03 NOTE — Telephone Encounter (Signed)
Patient was approved and his Pharmacy was notified,and they are calling the patient.

## 2016-08-09 ENCOUNTER — Telehealth (INDEPENDENT_AMBULATORY_CARE_PROVIDER_SITE_OTHER): Payer: Self-pay | Admitting: Internal Medicine

## 2016-08-09 NOTE — Telephone Encounter (Signed)
Dr.Rehman was given this progress report. Patient is to have procedure in September.

## 2016-08-09 NOTE — Telephone Encounter (Signed)
Patient called to give a progress report.  Stated that he is still nauseated, he got the pills and they help.  Still weak, not a lot of energy, but overall he is doing better.  Weight is 123.  Mobile - [phone]

## 2016-09-01 ENCOUNTER — Encounter (HOSPITAL_COMMUNITY): Payer: Self-pay | Admitting: *Deleted

## 2016-09-01 ENCOUNTER — Encounter (HOSPITAL_COMMUNITY)
Admission: RE | Disposition: A | Discharge status: Home / Self Care | Payer: Self-pay | Source: Ambulatory Visit | Attending: Internal Medicine

## 2016-09-01 ENCOUNTER — Ambulatory Visit (HOSPITAL_COMMUNITY)
Admission: RE | Admit: 2016-09-01 | Discharge: 2016-09-01 | Disposition: A | Discharge status: Home / Self Care | Payer: Medicare Other | Source: Ambulatory Visit | Attending: Internal Medicine | Admitting: Internal Medicine

## 2016-09-01 DIAGNOSIS — K766 Portal hypertension: Secondary | ICD-10-CM | POA: Diagnosis not present

## 2016-09-01 DIAGNOSIS — K219 Gastro-esophageal reflux disease without esophagitis: Secondary | ICD-10-CM | POA: Diagnosis not present

## 2016-09-01 DIAGNOSIS — D649 Anemia, unspecified: Secondary | ICD-10-CM | POA: Insufficient documentation

## 2016-09-01 DIAGNOSIS — Z96611 Presence of right artificial shoulder joint: Secondary | ICD-10-CM | POA: Diagnosis not present

## 2016-09-01 DIAGNOSIS — K2960 Other gastritis without bleeding: Secondary | ICD-10-CM | POA: Insufficient documentation

## 2016-09-01 DIAGNOSIS — K311 Adult hypertrophic pyloric stenosis: Secondary | ICD-10-CM | POA: Diagnosis not present

## 2016-09-01 DIAGNOSIS — K279 Peptic ulcer, site unspecified, unspecified as acute or chronic, without hemorrhage or perforation: Secondary | ICD-10-CM

## 2016-09-01 DIAGNOSIS — K257 Chronic gastric ulcer without hemorrhage or perforation: Secondary | ICD-10-CM | POA: Diagnosis present

## 2016-09-01 DIAGNOSIS — K228 Other specified diseases of esophagus: Secondary | ICD-10-CM | POA: Diagnosis not present

## 2016-09-01 DIAGNOSIS — K3189 Other diseases of stomach and duodenum: Secondary | ICD-10-CM

## 2016-09-01 DIAGNOSIS — K254 Chronic or unspecified gastric ulcer with hemorrhage: Secondary | ICD-10-CM | POA: Diagnosis not present

## 2016-09-01 DIAGNOSIS — N400 Enlarged prostate without lower urinary tract symptoms: Secondary | ICD-10-CM | POA: Diagnosis not present

## 2016-09-01 DIAGNOSIS — K449 Diaphragmatic hernia without obstruction or gangrene: Secondary | ICD-10-CM | POA: Diagnosis not present

## 2016-09-01 DIAGNOSIS — Z79899 Other long term (current) drug therapy: Secondary | ICD-10-CM | POA: Insufficient documentation

## 2016-09-01 HISTORY — PX: ESOPHAGOGASTRODUODENOSCOPY: SHX5428

## 2016-09-01 SURGERY — EGD (ESOPHAGOGASTRODUODENOSCOPY)
Anesthesia: Moderate Sedation

## 2016-09-01 MED ORDER — MIDAZOLAM HCL 5 MG/5ML IJ SOLN
INTRAMUSCULAR | Status: AC
  Filled 2016-09-01: qty 10

## 2016-09-01 MED ORDER — SODIUM CHLORIDE 0.9 % IV SOLN
INTRAVENOUS | Status: DC
  Administered 2016-09-01: 1000 mL via INTRAVENOUS

## 2016-09-01 MED ORDER — MEPERIDINE HCL 50 MG/ML IJ SOLN
INTRAMUSCULAR | Status: AC
  Filled 2016-09-01: qty 1

## 2016-09-01 MED ORDER — BUTAMBEN-TETRACAINE-BENZOCAINE 2-2-14 % EX AERO
INHALATION_SPRAY | CUTANEOUS | Status: DC | PRN
  Administered 2016-09-01: 2 via TOPICAL

## 2016-09-01 MED ORDER — MIDAZOLAM HCL 5 MG/5ML IJ SOLN
INTRAMUSCULAR | Status: DC | PRN
  Administered 2016-09-01: 1 mg via INTRAVENOUS
  Administered 2016-09-01 (×2): 2 mg via INTRAVENOUS
  Administered 2016-09-01: 1 mg via INTRAVENOUS
  Administered 2016-09-01: 2 mg via INTRAVENOUS

## 2016-09-01 MED ORDER — MEPERIDINE HCL 50 MG/ML IJ SOLN
INTRAMUSCULAR | Status: DC | PRN
  Administered 2016-09-01 (×2): 25 mg via INTRAVENOUS

## 2016-09-01 MED ORDER — STERILE WATER FOR IRRIGATION IR SOLN
Status: DC | PRN
  Administered 2016-09-01: 100 mL

## 2016-09-01 NOTE — Discharge Instructions (Signed)
Resume usual medications and diet. No driving for 24 hours. Physician will call with biopsy results and further recommendations.    Esophagogastroduodenoscopy Esophagogastroduodenoscopy (EGD) is a procedure that is used to examine the lining of the esophagus, stomach, and first part of the small intestine (duodenum). A long, flexible, lighted tube with a camera attached (endoscope) is inserted down the throat to view these organs. This procedure is done to detect problems or abnormalities, such as inflammation, bleeding, ulcers, or growths, in order to treat them. The procedure lasts 5-20 minutes. It is usually an outpatient procedure, but it may need to be performed in a hospital in emergency cases. LET [facility] CARE PROVIDER KNOW ABOUT:  Any allergies you have.  All medicines you are taking, including vitamins, herbs, eye drops, creams, and over-the-counter medicines.  Previous problems you or members of your family have had with the use of anesthetics.  Any blood disorders you have.  Previous surgeries you have had.  Medical conditions you have. RISKS AND COMPLICATIONS Generally, this is a safe procedure. However, problems can occur and include:  Infection.  Bleeding.  Tearing (perforation) of the esophagus, stomach, or duodenum.  Difficulty breathing or not being able to breathe.  Excessive sweating.  Spasms of the larynx.  Slowed heartbeat.  Low blood pressure. BEFORE THE PROCEDURE  Do not eat or drink anything after midnight on the night before the procedure or as directed by your health care provider.  Do not take your regular medicines before the procedure if your health care provider asks you not to. Ask your health care provider about changing or stopping those medicines.  If you wear dentures, be prepared to remove them before the procedure.  Arrange for someone to drive you home after the procedure. PROCEDURE  A numbing medicine (local anesthetic) may  be sprayed in your throat for comfort and to stop you from gagging or coughing.  You will have an IV tube inserted in a vein in your hand or arm. You will receive medicines and fluids through this tube.  You will be given a medicine to relax you (sedative).  A pain reliever will be given through the IV tube.  A mouth guard may be placed in your mouth to protect your teeth and to keep you from biting on the endoscope.  You will be asked to lie on your left side.  The endoscope will be inserted down your throat and into your esophagus, stomach, and duodenum.  Air will be put through the endoscope to allow your health care provider to clearly view the lining of your esophagus.  The lining of your esophagus, stomach, and duodenum will be examined. During the exam, your health care provider may:  Remove tissue to be examined under a microscope (biopsy) for inflammation, infection, or other medical problems.  Remove growths.  Remove objects (foreign bodies) that are stuck.  Treat any bleeding with medicines or other devices that stop tissues from bleeding (hot cautery, clipping devices).  Widen (dilate) or stretch narrowed areas of your esophagus and stomach.  The endoscope will be withdrawn. AFTER THE PROCEDURE  You will be taken to a recovery area for observation. Your blood pressure, heart rate, breathing rate, and blood oxygen level will be monitored often until the medicines you were given have worn off.  Do not eat or drink anything until the numbing medicine has worn off and your gag reflex has returned. You may choke.  Your health care provider should be able to  discuss his or her findings with you. It will take longer to discuss the test results if any biopsies were taken.   This information is not intended to replace advice given to you by your health care provider. Make sure you discuss any questions you have with your health care provider.   Document Released: 04/01/2005  Document Revised: 12/20/2014 Document Reviewed: 11/01/2012 Elsevier Interactive Patient Education [employer]2016 Else[employer] Inc.

## 2016-09-01 NOTE — H&P (Signed)
Joel Richards is an 65 y.o. male.   Chief Complaint: Patient is here for EGD and possible balloon dilation of stricture. 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 Biopsies were negative for H. pylori. He's been treated with double dose PPI. He does not take NSAIDs. He remains with frequent nausea. Is not having vomiting in 2 weeks. He feels he had 3 good days. He denies melena or rectal bleeding. He is undergoing EGD to document healing of peptic ulcer disease and to consider dilation of pyloric channel and or duodenal stricture if indicated.  Past Medical History:  Diagnosis Date  . Enlarged prostate   . GERD (gastroesophageal reflux disease)   . Migraines     Past Surgical History:  Procedure Laterality Date  . 5 Rt shoulder  surgery    . BACK SURGERY     x 2   . COLONOSCOPY    . ESOPHAGOGASTRODUODENOSCOPY N/A 06/30/2016   Procedure: ESOPHAGOGASTRODUODENOSCOPY (EGD);  Surgeon: Malissa HippoNajeeb U Aradia Estey, MD;  Location: [facility];  Service: Endoscopy;  Laterality: N/A;  1:00  . RECTAL SURGERY     fissure  . Rt shoulder replacement    . TONSILLECTOMY      Family History  Problem Relation Age of Onset  . Transient ischemic attack Mother   . Hypertension Mother   . Kidney failure Father    Social History:  reports that he has never smoked. He has never used smokeless tobacco. He reports that he does not drink alcohol or use drugs.  Allergies: No Known Allergies  Medications Prior to Admission  Medication Sig Dispense Refill  . alum & mag hydroxide-simeth (MAALOX/MYLANTA) 200-200-20 MG/5ML suspension Take by mouth at bedtime as needed for indigestion or heartburn.     Marland Kitchen. b complex vitamins capsule Take 1 capsule by mouth daily.    . calcium carbonate (OS-CAL) 1250 (500 Ca) MG chewable tablet Chew 1 tablet  by mouth daily.    Marland Kitchen. esomeprazole (NEXIUM) 40 MG capsule Take 1 capsule (40 mg total) by mouth 3 (three) times daily before meals. 90 capsule 3  . mineral oil liquid Take 15 mLs by mouth at bedtime.     . propranolol (INDERAL) 80 MG tablet Take 80 mg by mouth daily.     . sildenafil (REVATIO) 20 MG tablet Take 20 mg by mouth as needed. 5 tablets as needed    . SUMAtriptan (IMITREX) 6 MG/0.5ML SOLN injection Inject 6 mg into the skin every 2 (two) hours as needed for migraine or headache. May repeat in 2 hours if headache persists or recurs.    . tamsulosin (FLOMAX) 0.4 MG CAPS capsule Take 0.4 mg by mouth daily.     Marland Kitchen. zolpidem (AMBIEN CR) 12.5 MG CR tablet Take 12.5 mg by mouth at bedtime as needed for sleep.    . fluticasone (FLONASE) 50 MCG/ACT nasal spray Place into both nostrils daily as needed.     . loperamide (IMODIUM) 2 MG capsule Take by mouth as needed for diarrhea or loose stools.    . sucralfate (CARAFATE) 1 GM/10ML suspension Take 1 g by mouth daily as needed.     . SUMAtriptan (IMITREX) 100 MG tablet Take 100 mg by mouth every 2 (two) hours as needed for migraine. May repeat in 2 hours if headache persists or recurs.  No results found for this or any previous visit (from the past 48 hour(s)). No results found.  ROS  Blood pressure 122/88, pulse 68, temperature 97.8 F (36.6 C), temperature source Oral, resp. rate 12, height 5\' 9"  (1.753 m), weight 123 lb (55.8 kg), SpO2 99 %. Physical Exam  Constitutional:  Well-developed thin Caucasian male in NAD.  HENT:  Mouth/Throat: Oropharynx is clear and moist.  Eyes: Conjunctivae are normal. No scleral icterus.  Neck: No thyromegaly present.  Cardiovascular: Normal rate, regular rhythm and normal heart sounds.   No murmur heard. Respiratory: Effort normal and breath sounds normal.  GI:  Abdomen is symmetrical soft and nontender without organomegaly or masses.  Musculoskeletal: He exhibits no edema.  Lymphadenopathy:    He  has no cervical adenopathy.  Neurological: He is alert.  Skin: Skin is warm and dry.     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 EGD and possible balloon dilation of stricture.  Lionel December, MD 09/01/2016, 1:18 PM

## 2016-09-01 NOTE — Op Note (Signed)
[facility] Patient Name: Joel BasquesRaymond Postle Procedure Date: 09/01/2016 12:56 PM MRN: [id number] Date of Birth: [birth year] Attending MD: Lionel DecemberNajeeb Rehman , MD CSN: [id number] Age: 65 Admit Type: Outpatient Procedure:                Upper GI endoscopy Indications:              Follow-up of chronic gastric ulcer. Failure to                            respond to medical treatment, Providers:                Lionel DecemberNajeeb Rehman, MD, Edrick Kinsammy Vaught, RN, Burke Keelsrisann                            Tilley, Technician Referring MD:              Medicines:                Cetacaine spray, Meperidine 50 mg IV, Midazolam 8                            mg IV Complications:            No immediate complications. Estimated Blood Loss:     Estimated blood loss was minimal. Procedure:                Pre-Anesthesia Assessment:                           - Prior to the procedure, a History and Physical                            was performed, and patient medications and                            allergies were reviewed. The patient's tolerance of                            previous anesthesia was also reviewed. The risks                            and benefits of the procedure and the sedation                            options and risks were discussed with the patient.                            All questions were answered, and informed consent                            was obtained. Prior Anticoagulants: The patient has                            taken no previous anticoagulant or antiplatelet  agents. ASA Grade Assessment: II - A patient with                            mild systemic disease. After reviewing the risks                            and benefits, the patient was deemed in                            satisfactory condition to undergo the procedure.                           After obtaining informed consent, the endoscope was                            passed under direct vision.  Throughout the                            procedure, the patient's blood pressure, pulse, and                            oxygen saturations were monitored continuously. The                            EG-[id number] ([id number]) scope was introduced through the                            mouth, and advanced to the prepyloric region,                            stomach. The upper GI endoscopy was accomplished                            without difficulty. The patient tolerated the                            procedure well. Scope In: 1:30:56 PM Scope Out: 1:45:43 PM Total Procedure Duration: 0 hours 14 minutes 47 seconds  Findings:      The examined esophagus was normal.      The Z-line was irregular and was found 41 cm from the incisors.      A 2 cm hiatal hernia was present.      Bilious fluid was found in the gastric body.      Portal hypertensive gastropathy was found in the gastric fundus and in       the gastric body. Biopsies were taken with a cold forceps for histology.      One oozing cratered gastric ulcer was found in the prepyloric region of       the stomach and at the pylorus. The lesion was circumferential 30 mm in       largest dimension. Biopsies were taken with a cold forceps for histology.      A benign-appearing, intrinsic severe stenosis was found at the pylorus.       This was non-traversed.      An examination of the duodenum  was not performed. Impression:               - Normal esophagus.                           - Z-line irregular, 41 cm from the incisors.                           - 2 cm hiatal hernia.                           - Bilious gastric fluid ut no food debris present.                           - Portal hypertensive gastropathy. Biopsied.                           - Oozing gastric ulcer at prepylorus and pyloric                            channel. Biopsied.                           - Gastric stenosis was found at the pylorus. Moderate Sedation:      Moderate  (conscious) sedation was administered by the endoscopy nurse       and supervised by the endoscopist. The following parameters were       monitored: oxygen saturation, heart rate, blood pressure, CO2       capnography and response to care. Total physician intraservice time was       21 minutes. Recommendation:           - Patient has a contact number available for                            emergencies. The signs and symptoms of potential                            delayed complications were discussed with the                            patient. Return to normal activities tomorrow.                            Written discharge instructions were provided to the                            patient.                           - Resume previous diet today.                           - Continue present medications.                           - Await pathology results.                           -  Patient will need surgical conon or                            referraertiary cente Procedure Code(s):        --- Professional ---                           [phone], 52, Esophagogastroduodenoscopy, flexible,                            transoral; with biopsy, single or multiple                           99152, Moderate sedation services provided by the                            same physician or other qualified health care                            professional performing the diagnostic or                            therapeutic service that the sedation supports,                            requiring the presence of an independent trained                            observer to assist in the monitoring of the                            patient's level of consciousness and physiological                            status; initial 15 minutes of intraservice time,                            patient age 64 years or older Diagnosis Code(s):        --- Professional ---                           K22.8, Other  specified diseases of esophagus                           K44.9, Diaphragmatic hernia without obstruction or                            gangrene                           K76.6, Portal hypertension                           K31.89, Other diseases of stomach and duodenum  K25.4, Chronic or unspecified gastric ulcer with                            hemorrhage                           K31.1, Adult hypertrophic pyloric stenosis                           K25.7, Chronic gastric ulcer without hemorrhage or                            perforation CPT copyright 2016 [employer]. All rights reserved. The codes documented in this report are preliminary and upon coder review may  be revised to meet current compliance requirements. Lionel December, MD Lionel December, MD 09/01/2016 2:05:23 PM This report has been signed electronically. Number of Addenda: 0

## 2016-09-09 ENCOUNTER — Encounter (HOSPITAL_COMMUNITY): Payer: Self-pay | Admitting: Internal Medicine

## 2016-11-09 ENCOUNTER — Ambulatory Visit (INDEPENDENT_AMBULATORY_CARE_PROVIDER_SITE_OTHER): Payer: Medicare Other | Admitting: Internal Medicine

## 2016-11-23 ENCOUNTER — Other Ambulatory Visit (INDEPENDENT_AMBULATORY_CARE_PROVIDER_SITE_OTHER): Payer: Self-pay | Admitting: Internal Medicine

## 2017-02-16 ENCOUNTER — Emergency Department (HOSPITAL_COMMUNITY): Payer: Medicare Other

## 2017-02-16 ENCOUNTER — Encounter (HOSPITAL_COMMUNITY): Payer: Self-pay | Admitting: Emergency Medicine

## 2017-02-16 ENCOUNTER — Emergency Department (HOSPITAL_COMMUNITY)
Admission: EM | Admit: 2017-02-16 | Discharge: 2017-02-16 | Disposition: A | Discharge status: Other Acute Inpatient Hospital | Payer: Medicare Other | Attending: Emergency Medicine | Admitting: Emergency Medicine

## 2017-02-16 DIAGNOSIS — R509 Fever, unspecified: Secondary | ICD-10-CM | POA: Diagnosis not present

## 2017-02-16 DIAGNOSIS — R079 Chest pain, unspecified: Secondary | ICD-10-CM | POA: Insufficient documentation

## 2017-02-16 DIAGNOSIS — R1110 Vomiting, unspecified: Secondary | ICD-10-CM | POA: Insufficient documentation

## 2017-02-16 DIAGNOSIS — R1084 Generalized abdominal pain: Secondary | ICD-10-CM | POA: Insufficient documentation

## 2017-02-16 DIAGNOSIS — Z79899 Other long term (current) drug therapy: Secondary | ICD-10-CM | POA: Insufficient documentation

## 2017-02-16 DIAGNOSIS — R109 Unspecified abdominal pain: Secondary | ICD-10-CM | POA: Diagnosis present

## 2017-02-16 HISTORY — DX: Adult hypertrophic pyloric stenosis: K31.1

## 2017-02-16 HISTORY — DX: Personal history of peptic ulcer disease: Z87.11

## 2017-02-16 HISTORY — DX: Personal history of other diseases of the digestive system: Z87.19

## 2017-02-16 LAB — HEPATIC FUNCTION PANEL
ALBUMIN: 3.6 g/dL (ref 3.5–5.0)
ALK PHOS: 88 U/L (ref 38–126)
ALT: 17 U/L (ref 17–63)
AST: 31 U/L (ref 15–41)
BILIRUBIN INDIRECT: 0.7 mg/dL (ref 0.3–0.9)
BILIRUBIN TOTAL: 0.8 mg/dL (ref 0.3–1.2)
Bilirubin, Direct: 0.1 mg/dL (ref 0.1–0.5)
TOTAL PROTEIN: 7.2 g/dL (ref 6.5–8.1)

## 2017-02-16 LAB — BASIC METABOLIC PANEL
ANION GAP: 12 (ref 5–15)
BUN: 14 mg/dL (ref 6–20)
CO2: 24 mmol/L (ref 22–32)
CREATININE: 0.99 mg/dL (ref 0.61–1.24)
Calcium: 9.7 mg/dL (ref 8.9–10.3)
Chloride: 98 mmol/L — ABNORMAL LOW (ref 101–111)
GLUCOSE: 134 mg/dL — AB (ref 65–99)
Potassium: 3.8 mmol/L (ref 3.5–5.1)
Sodium: 134 mmol/L — ABNORMAL LOW (ref 135–145)

## 2017-02-16 LAB — CBC WITH DIFFERENTIAL/PLATELET
BASOS ABS: 0 10*3/uL (ref 0.0–0.1)
BASOS PCT: 0 %
EOS PCT: 0 %
Eosinophils Absolute: 0 10*3/uL (ref 0.0–0.7)
HCT: 42 % (ref 39.0–52.0)
Hemoglobin: 13.8 g/dL (ref 13.0–17.0)
Lymphocytes Relative: 7 %
Lymphs Abs: 0.8 10*3/uL (ref 0.7–4.0)
MCH: 27.1 pg (ref 26.0–34.0)
MCHC: 32.9 g/dL (ref 30.0–36.0)
MCV: 82.5 fL (ref 78.0–100.0)
MONO ABS: 0.6 10*3/uL (ref 0.1–1.0)
MONOS PCT: 5 %
Neutro Abs: 9.6 10*3/uL — ABNORMAL HIGH (ref 1.7–7.7)
Neutrophils Relative %: 88 %
PLATELETS: 295 10*3/uL (ref 150–400)
RBC: 5.09 MIL/uL (ref 4.22–5.81)
RDW: 15.7 % — AB (ref 11.5–15.5)
WBC: 10.9 10*3/uL — ABNORMAL HIGH (ref 4.0–10.5)

## 2017-02-16 LAB — LIPASE, BLOOD: Lipase: 54 U/L — ABNORMAL HIGH (ref 11–51)

## 2017-02-16 LAB — LACTIC ACID, PLASMA: LACTIC ACID, VENOUS: 1.6 mmol/L (ref 0.5–1.9)

## 2017-02-16 MED ORDER — IOPAMIDOL (ISOVUE-300) INJECTION 61%
100.0000 mL | Freq: Once | INTRAVENOUS | Status: AC | PRN
  Administered 2017-02-16: 100 mL via INTRAVENOUS

## 2017-02-16 MED ORDER — HYDROMORPHONE HCL 2 MG/ML IJ SOLN
2.0000 mg | Freq: Once | INTRAMUSCULAR | Status: AC
  Administered 2017-02-16: 2 mg via INTRAVENOUS
  Filled 2017-02-16: qty 1

## 2017-02-16 MED ORDER — SODIUM CHLORIDE 0.9 % IV BOLUS (SEPSIS): 1000.0000 mL | Freq: Once | INTRAVENOUS | Status: DC

## 2017-02-16 MED ORDER — SODIUM CHLORIDE 0.9 % IV SOLN
Freq: Once | INTRAVENOUS | Status: AC
  Administered 2017-02-16: 19:00:00 via INTRAVENOUS

## 2017-02-16 MED ORDER — ONDANSETRON HCL 4 MG/2ML IJ SOLN
4.0000 mg | Freq: Once | INTRAMUSCULAR | Status: AC
  Administered 2017-02-16: 4 mg via INTRAVENOUS
  Filled 2017-02-16: qty 2

## 2017-02-16 MED ORDER — HYDROMORPHONE HCL 1 MG/ML IJ SOLN
INTRAMUSCULAR | Status: DC
Start: 2017-02-16 — End: 2017-02-16
  Filled 2017-02-16: qty 1

## 2017-02-16 MED ORDER — HYDROMORPHONE HCL 2 MG/ML IJ SOLN
2.0000 mg | Freq: Once | INTRAMUSCULAR | Status: AC
  Administered 2017-02-16: 2 mg via INTRAVENOUS

## 2017-02-16 MED ORDER — ACETAMINOPHEN 325 MG RE SUPP
RECTAL | Status: AC
  Administered 2017-02-16: 975 mg
  Filled 2017-02-16: qty 3

## 2017-02-16 MED ORDER — SODIUM CHLORIDE 0.9 % IV SOLN
1000.0000 mL | Freq: Once | INTRAVENOUS | Status: AC
  Administered 2017-02-16: 1000 mL via INTRAVENOUS

## 2017-02-16 MED ORDER — HYDROMORPHONE HCL 1 MG/ML IJ SOLN: 1.0000 mg | Freq: Once | INTRAMUSCULAR | Status: DC

## 2017-02-16 MED ORDER — IOPAMIDOL (ISOVUE-300) INJECTION 61%
INTRAVENOUS | Status: AC
  Administered 2017-02-16: 30 mL
  Filled 2017-02-16: qty 30

## 2017-02-16 MED ORDER — ACETAMINOPHEN 650 MG RE SUPP: 975.0000 mg | Freq: Once | RECTAL | Status: DC

## 2017-02-16 MED ORDER — SODIUM CHLORIDE 0.9 % IV SOLN
1000.0000 mL | INTRAVENOUS | Status: DC
  Administered 2017-02-16: 1000 mL via INTRAVENOUS

## 2017-02-16 MED ORDER — MORPHINE SULFATE (PF) 4 MG/ML IV SOLN
8.0000 mg | Freq: Once | INTRAVENOUS | Status: AC
  Administered 2017-02-16: 8 mg via INTRAVENOUS
  Filled 2017-02-16 (×2): qty 2

## 2017-02-16 MED ORDER — HYDROMORPHONE HCL 1 MG/ML IJ SOLN
1.0000 mg | Freq: Once | INTRAMUSCULAR | Status: AC
  Administered 2017-02-16: 1 mg via INTRAVENOUS

## 2017-02-16 MED ORDER — HYDROMORPHONE HCL 1 MG/ML IJ SOLN
1.0000 mg | INTRAMUSCULAR | Status: AC
  Administered 2017-02-16: 1 mg via INTRAVENOUS

## 2017-02-16 MED ORDER — HYDROMORPHONE HCL 1 MG/ML IJ SOLN
INTRAMUSCULAR | Status: AC
  Filled 2017-02-16: qty 1

## 2017-02-16 MED ORDER — HYDROMORPHONE HCL 2 MG/ML IJ SOLN
INTRAMUSCULAR | Status: AC
  Administered 2017-02-16: 2 mg via INTRAVENOUS
  Filled 2017-02-16: qty 1

## 2017-02-16 NOTE — ED Notes (Signed)
Pt taken to xray 

## 2017-02-16 NOTE — ED Notes (Signed)
Pt in CT. CT called and states pt in a lot of pain. Heard pt moaning in back ground. edp aware and vo to give dilaudid 2mg  iv stat. Repeated and verified.

## 2017-02-16 NOTE — ED Provider Notes (Addendum)
[facility] DEPT Provider Note   CSN: [id number] Arrival date & time: 02/16/17  1141  By signing my name below, I, [name], attest that this documentation has been prepared under the direction and in the presence of [name], MD . Electronically Signed: [name], ED Scribe. 02/16/2017. 12:21 PM.    History   Chief Complaint Chief Complaint  Patient presents with  . Abdominal Pain    The history is provided by the patient. No language interpreter was used.   HPI Comments:  Joel Richards is a 66 y.o. male who presents to the Emergency Department complaining of constant abdominal pain since yesterday. Pt reports hx of bowel obstructions, and had surgery 3 weeks ago for pyloric stenosis and gastric outlet obstruction.  He had a laparosocopic antrectomy, truncal vagotomy and placement of mickey J tube.  Pt was referred to the ED from [facility] given his severe pain.  Pt reports 1 episode of vomiting and has been unable to pass gas. He complains of associated chest pain due to increased burping. Oral intake has been normal. Pt took 2 hydrocodone yesterday for sciatica pains. No alleviating factors noted. Pt denies other associated symptoms. Pain is severe at this time     Past Medical History:  Diagnosis Date  . Enlarged prostate   . GERD (gastroesophageal reflux disease)   . History of stomach ulcers   . Migraines   . Pyloric stenosis     Patient Active Problem List   Diagnosis Date Noted  . PUD (peptic ulcer disease) 07/20/2016  . Absolute anemia 07/20/2016  . GERD (gastroesophageal reflux disease) 04/22/2016  . Migraines 04/22/2016  . Enlarged prostate 04/22/2016    Past Surgical History:  Procedure Laterality Date  . 5 Rt shoulder  surgery    . BACK SURGERY     x 2   . COLONOSCOPY    . ESOPHAGOGASTRODUODENOSCOPY N/A 06/30/2016   Procedure: ESOPHAGOGASTRODUODENOSCOPY (EGD);  Surgeon: Malissa Hippo, MD;  Location: [facility];  Service:  Endoscopy;  Laterality: N/A;  1:00  . ESOPHAGOGASTRODUODENOSCOPY N/A 09/01/2016   Procedure: ESOPHAGOGASTRODUODENOSCOPY (EGD);  Surgeon: Malissa Hippo, MD;  Location: [facility];  Service: Endoscopy;  Laterality: N/A;  3:00  . RECTAL SURGERY     fissure  . Rt shoulder replacement    . TONSILLECTOMY         Home Medications    Prior to Admission medications   Medication Sig Start Date End Date Taking? Authorizing Provider  alum & mag hydroxide-simeth (MAALOX/MYLANTA) 200-200-20 MG/5ML suspension Take by mouth at bedtime as needed for indigestion or heartburn.     Historical Provider, MD  b complex vitamins capsule Take 1 capsule by mouth daily.    Historical Provider, MD  calcium carbonate (OS-CAL) 1250 (500 Ca) MG chewable tablet Chew 1 tablet by mouth daily.    Historical Provider, MD  esomeprazole (NEXIUM) 40 MG capsule Take 1 capsule (40 mg total) by mouth 3 (three) times daily before meals. 07/15/16   Malissa Hippo, MD  fluticasone (FLONASE) 50 MCG/ACT nasal spray Place into both nostrils daily as needed.     Historical Provider, MD  loperamide (IMODIUM) 2 MG capsule Take by mouth as needed for diarrhea or loose stools.    Historical Provider, MD  mineral oil liquid Take 15 mLs by mouth at bedtime.     Historical Provider, MD  ondansetron (ZOFRAN) 4 MG tablet TAKE 1 TABLET BY MOUTH EVERY 8 HOURS AS NEEDED FOR  NAUSEA OR VOMITING 11/25/16   Malissa Hippo, MD  propranolol (INDERAL) 80 MG tablet Take 80 mg by mouth daily.     Historical Provider, MD  sildenafil (REVATIO) 20 MG tablet Take 20 mg by mouth as needed. 5 tablets as needed    Historical Provider, MD  sucralfate (CARAFATE) 1 GM/10ML suspension Take 1 g by mouth daily as needed.     Historical Provider, MD  SUMAtriptan (IMITREX) 100 MG tablet Take 100 mg by mouth every 2 (two) hours as needed for migraine. May repeat in 2 hours if headache persists or recurs.    Historical Provider, MD  SUMAtriptan (IMITREX) 6 MG/0.5ML SOLN  injection Inject 6 mg into the skin every 2 (two) hours as needed for migraine or headache. May repeat in 2 hours if headache persists or recurs.    Historical Provider, MD  tamsulosin (FLOMAX) 0.4 MG CAPS capsule Take 0.4 mg by mouth daily.     Historical Provider, MD  zolpidem (AMBIEN CR) 12.5 MG CR tablet Take 12.5 mg by mouth at bedtime as needed for sleep.    Historical Provider, MD    Family History Family History  Problem Relation Age of Onset  . Transient ischemic attack Mother   . Hypertension Mother   . Kidney failure Father     Social History Social History  Substance Use Topics  . Smoking status: Never Smoker  . Smokeless tobacco: Never Used  . Alcohol use No     Allergies   Patient has no known allergies.   Review of Systems Review of Systems  Cardiovascular: Positive for chest pain.  Gastrointestinal: Positive for abdominal pain and vomiting.  Musculoskeletal: Positive for back pain.  All other systems reviewed and are negative.    Physical Exam Updated Vital Signs BP 153/96   Pulse 109   Temp 97.4 F (36.3 C) (Oral)   Resp 22   Ht 5\' 9"  (1.753 m)   Wt 130 lb (59 kg)   SpO2 98%   BMI 19.20 kg/m   Physical Exam  Constitutional: He is oriented to person, place, and time. He appears well-developed and well-nourished.  HENT:  Head: Normocephalic and atraumatic.  Eyes: EOM are normal.  Neck: Normal range of motion.  Cardiovascular: Normal rate, regular rhythm, normal heart sounds and intact distal pulses.   Pulmonary/Chest: Effort normal and breath sounds normal. No respiratory distress.  Abdominal: Soft. He exhibits no distension. There is no tenderness.  Musculoskeletal: Normal range of motion.  Neurological: He is alert and oriented to person, place, and time.  Skin: Skin is warm and dry.  Psychiatric: He has a normal mood and affect. Judgment normal.  Nursing note and vitals reviewed.    ED Treatments / Results  DIAGNOSTIC  STUDIES:  Oxygen Saturation is 98% on RA, normal by my interpretation.    COORDINATION OF CARE:  12:21 PM  Discussed treatment plan with pt at bedside and pt agreed to plan.   Labs (all labs ordered are listed, but only abnormal results are displayed) Labs Reviewed  CBC WITH DIFFERENTIAL/PLATELET - Abnormal; Notable for the following:       Result Value   WBC 10.9 (*)    RDW 15.7 (*)    Neutro Abs 9.6 (*)    All other components within normal limits  BASIC METABOLIC PANEL - Abnormal; Notable for the following:    Sodium 134 (*)    Chloride 98 (*)    Glucose, Bld 134 (*)  All other components within normal limits  LIPASE, BLOOD - Abnormal; Notable for the following:    Lipase 54 (*)    All other components within normal limits  HEPATIC FUNCTION PANEL  LACTIC ACID, PLASMA    EKG  EKG Interpretation  Date/Time:  Wednesday February 16 2017 18:44:17 EST Ventricular Rate:  106 PR Interval:    QRS Duration: 85 QT Interval:  342 QTC Calculation: 455 R Axis:   63 Text Interpretation:  Sinus tachycardia No old tracing to compare Confirmed by Naya Ilagan  MD, Caryn Bee ([id number]) on 02/16/2017 7:43:48 PM       Radiology Ct Abdomen Pelvis W Contrast  Addendum Date: 02/16/2017   ADDENDUM REPORT: 02/16/2017 17:59 ADDENDUM: Pancreatitis cannot be excluded on this study. Portal vein and superior mesenteric vein are patent. Celiac axis and SMA are patent. Electronically Signed   By: Kennith Center M.D.   On: 02/16/2017 17:59   Result Date: 02/16/2017 CLINICAL DATA:  Constant abdominal pain since yesterday. history of gastric ulcers refractory to medical treatment with PPIs who developed a gastric outlet obstruction secondary to an enlarging prepyloric ulcer and worsening pyloric stricture. Patient underwent laparoscopic antrectomy, truncal vagaotomy and feeding mickey J tube placement with small bowel resection on 01/17/17 at [facility].Per EP[facility], pathology on the small-bowel resected during surgery demonstrated  Crohn disease. EXAM: CT ABDOMEN AND PELVIS WITH CONTRAST TECHNIQUE: Multidetector CT imaging of the abdomen and pelvis was performed using the standard protocol following bolus administration of intravenous contrast. CONTRAST:  ISOVUE-300 IOPAMIDOL (ISOVUE-300) INJECTION 61% COMPARISON:  None. FINDINGS: Lower chest: Peribronchovascular nodularity identified right lung base. Hepatobiliary: Multiple hepatic cysts measure up to 2.5 cm diameter. Gallbladder is distended. No intrahepatic or extrahepatic biliary dilation. Pancreas: There is some fullness and ill definition in the uncinate process of the pancreas. No dilatation of the main duct. Pancreatic body and tail are unremarkable. Spleen: No splenomegaly. No focal mass lesion. Adrenals/Urinary Tract: No adrenal nodule or mass. 6 mm low-density lesion interpolar right kidney is too small to characterize but likely a cyst. 2.0 cm low-density lesion interpolar left kidney is compatible with a cyst. No evidence for hydroureter. The urinary bladder appears normal for the degree of distention. Stomach/Bowel: Patient is status post distal gastrectomy. Oral contrast material is seen in the stomach and in the efferent limb. Surgically placed J-tube is visualized and the lumen around the J-tube bulb is opacified with retrograde reflux of contrast material into the duodenal stump. There is no evidence for small bowel obstruction. Circumferential wall thickening noted in some segments of small bowel may be related to underdistention although active inflammation cannot be excluded. There is a loop of small bowel in the right pelvis (image 63 series 2) that demonstrates fecalization of enteric contents. This loop is approximately 20-30 cm proximal to the ileocecal bowel. The terminal ileum is unremarkable. The appendix is not visualized. Cecum has normal imaging features. There is a segment of ascending colon (see images 36 - 44 of series 2) that has circumferential wall  thickening and luminal narrowing. There may be some hyperenhancement in this segment of the ascending colon. Transverse colon distal to the abnormal segment is normal in appearance and well distended with gas and stool. Left colon unremarkable. Vascular/Lymphatic: No abdominal aortic aneurysm. No abdominal aortic atherosclerotic calcification. There is no gastrohepatic or hepatoduodenal ligament lymphadenopathy. No intraperitoneal or retroperitoneal lymphadenopathy. There is fairly marked edema in the root of the small bowel mesentery which tracks centrally and appears to have some related  edema/ fluid in the anterior pararenal space around the pancreatic head and duodenum. The edema/ fluid also tracks down around the ascending colon. Reproductive: Prostate gland appears mildly enlarged. Other: There is some minimal fluid around the liver and in the right para colic gutter. No substantial fluid in the anatomic pelvis. Musculoskeletal: Bone windows reveal no worrisome lytic or sclerotic osseous lesions. IMPRESSION: 1. Circumferential wall thickening and pericolonic edema identified in the region of the mid ascending colon. 2. Mildly distended loop of small bowel in the right abdomen with fecalization of contents, suggesting stasis. A component of inflammatory stricturing distal to this region is a consideration, There are no features of overt small bowel obstruction on the current study. 3. Fairly extensive edema/inflammation identified in the small bowel mesenteric the upper abdomen which tracks centrally along the mesenteric vessels. There also appears to be edema/inflammation in the anterior pararenal space around the head of the pancreas and duodenum stump. 4. No evidence for intraperitoneal free air. There is no evidence of contrast extravasation in the region of the gastrojejunostomy, duodenal stump, J-tube, or Roux anastomosis to suggest leak. No CT features to suggest abscess. 5. Small volume fluid identified  around the liver and in the right para colic gutter. No free fluid identified in the anatomic pelvis. Electronically Signed: By: Kennith CenterEric  Mansell M.D. On: 02/16/2017 17:38   Dg Abdomen Acute W/chest  Result Date: 02/16/2017 CLINICAL DATA:  Constant abdominal pain. EXAM: DG ABDOMEN ACUTE W/ 1V CHEST COMPARISON:  None. FINDINGS: There is no evidence of dilated bowel loops or free intraperitoneal air. No radiopaque calculi or other significant radiographic abnormality is seen. Left lateral abdominal drainage catheter. Large amount of stool in the ascending colon. Heart size and mediastinal contours are within normal limits. Both lungs are clear. Right shoulder arthroplasty. IMPRESSION: Large amount of stool in the ascending colon. No acute cardiopulmonary disease. Electronically Signed   By: Elige KoHetal  Patel   On: 02/16/2017 12:44    Procedures Procedures (including critical care time)  Medications Ordered in ED Medications  0.9 %  sodium chloride infusion (0 mLs Intravenous Stopped 02/16/17 1343)    Followed by  0.9 %  sodium chloride infusion (1,000 mLs Intravenous Rate/Dose Change 02/16/17 1804)  sodium chloride 0.9 % bolus 1,000 mL (not administered)  acetaminophen (TYLENOL) suppository 975 mg (not administered)  morphine 4 MG/ML injection 8 mg (not administered)  ondansetron (ZOFRAN) injection 4 mg (4 mg Intravenous Given 02/16/17 1240)  HYDROmorphone (DILAUDID) injection 2 mg (2 mg Intravenous Given 02/16/17 1240)  HYDROmorphone (DILAUDID) injection 1 mg (1 mg Intravenous Given 02/16/17 1402)  iopamidol (ISOVUE-300) 61 % injection (30 mLs  Contrast Given 02/16/17 1421)  iopamidol (ISOVUE-300) 61 % injection 100 mL (100 mLs Intravenous Contrast Given 02/16/17 1627)  HYDROmorphone (DILAUDID) injection 2 mg (2 mg Intravenous Given 02/16/17 1628)  acetaminophen (TYLENOL) 325 MG suppository (975 mg  Given 02/16/17 1800)     Initial Impression / Assessment and Plan / ED Course  I have reviewed the triage vital signs  and the nursing notes.  Pertinent labs & imaging results that were available during my care of the patient were reviewed by me and considered in my medical decision making (see chart for details).    6:06 PM [facility] Accepts in transfer to the ER. pts pain treated. Rectal temp 100.9. Lactate pending. Additional fluid now. Several nonspecific findings on CT without obvious post surgical complication. Will continue to monitor the pt closely in the  ER.   7:44 PM Stable HR improving. Additional pain meds now. Lactate 1.6. Transfer team in ER now.    Final Clinical Impressions(s) / ED Diagnoses   Final diagnoses:  Generalized abdominal pain  Fever, unspecified fever cause    New Prescriptions New Prescriptions   No medications on file   I personally performed the services described in this documentation, which was scribed in my presence. The recorded information has been reviewed and is accurate.        [name], MD 02/16/17 Harrietta Guardian    [name], MD 02/16/17 2114

## 2017-02-16 NOTE — ED Notes (Signed)
vo to turn 2nd liter that is currently maintanence to WO. 2nd Liter WO at this time. Then will re eval.

## 2017-02-16 NOTE — ED Notes (Signed)
Pt much more calm. 

## 2017-02-16 NOTE — ED Triage Notes (Signed)
Told to come to ED from [facility]  C/o abdominal pain, rating pain 10/10.  Post-op 01/17/17 for abdominal surgery.

## 2017-02-16 NOTE — ED Notes (Signed)
Pt is currently awaiting transfer, updated on the same.

## 2017-02-16 NOTE — ED Notes (Signed)
Stopped using the J tube during the night 5 days ago and has been only by mouth since. Recent hx of crohns. J tube intact.

## 2017-05-31 IMAGING — DX DG ABDOMEN ACUTE W/ 1V CHEST
3 series · 3 of 3 positions shown · non-contrast
Comparison: None.

CLINICAL DATA: Constant abdominal pain.

EXAM:
DG ABDOMEN ACUTE W/ 1V CHEST

[chest pa]
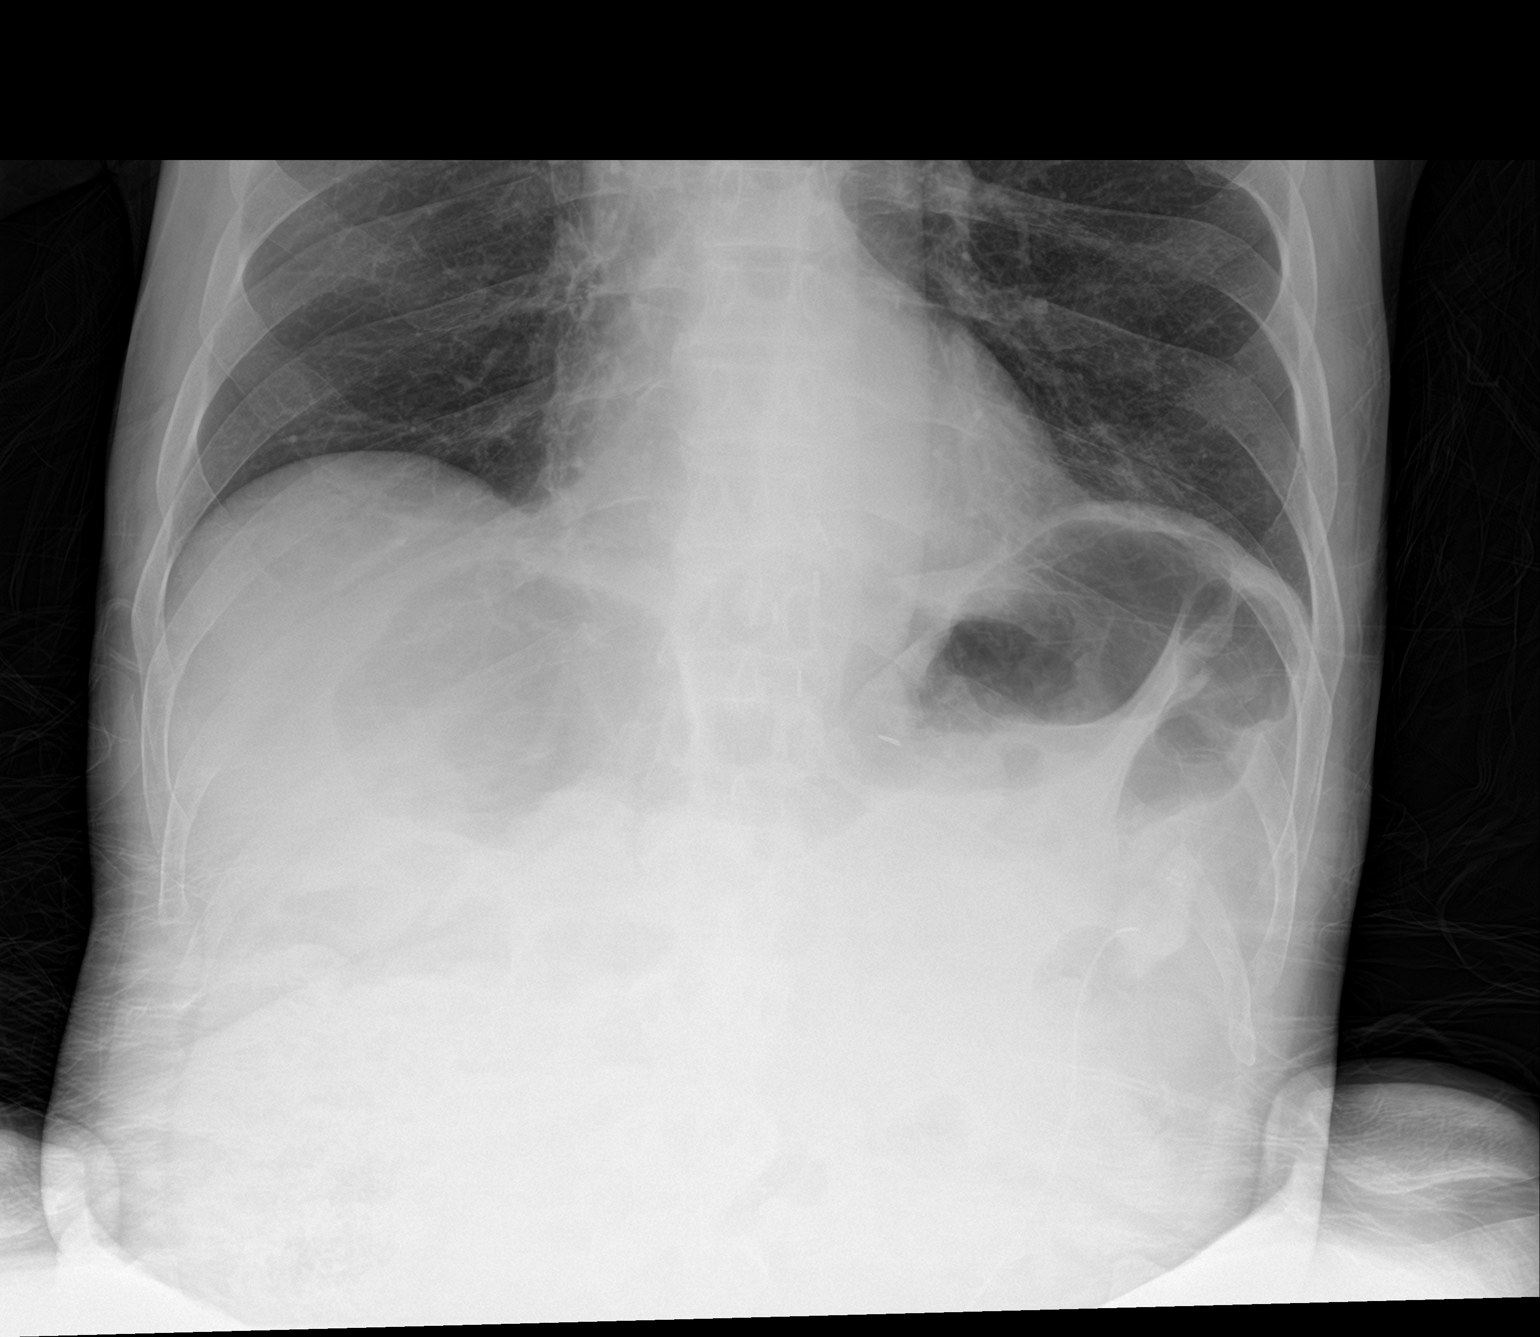

[abdomen supine]
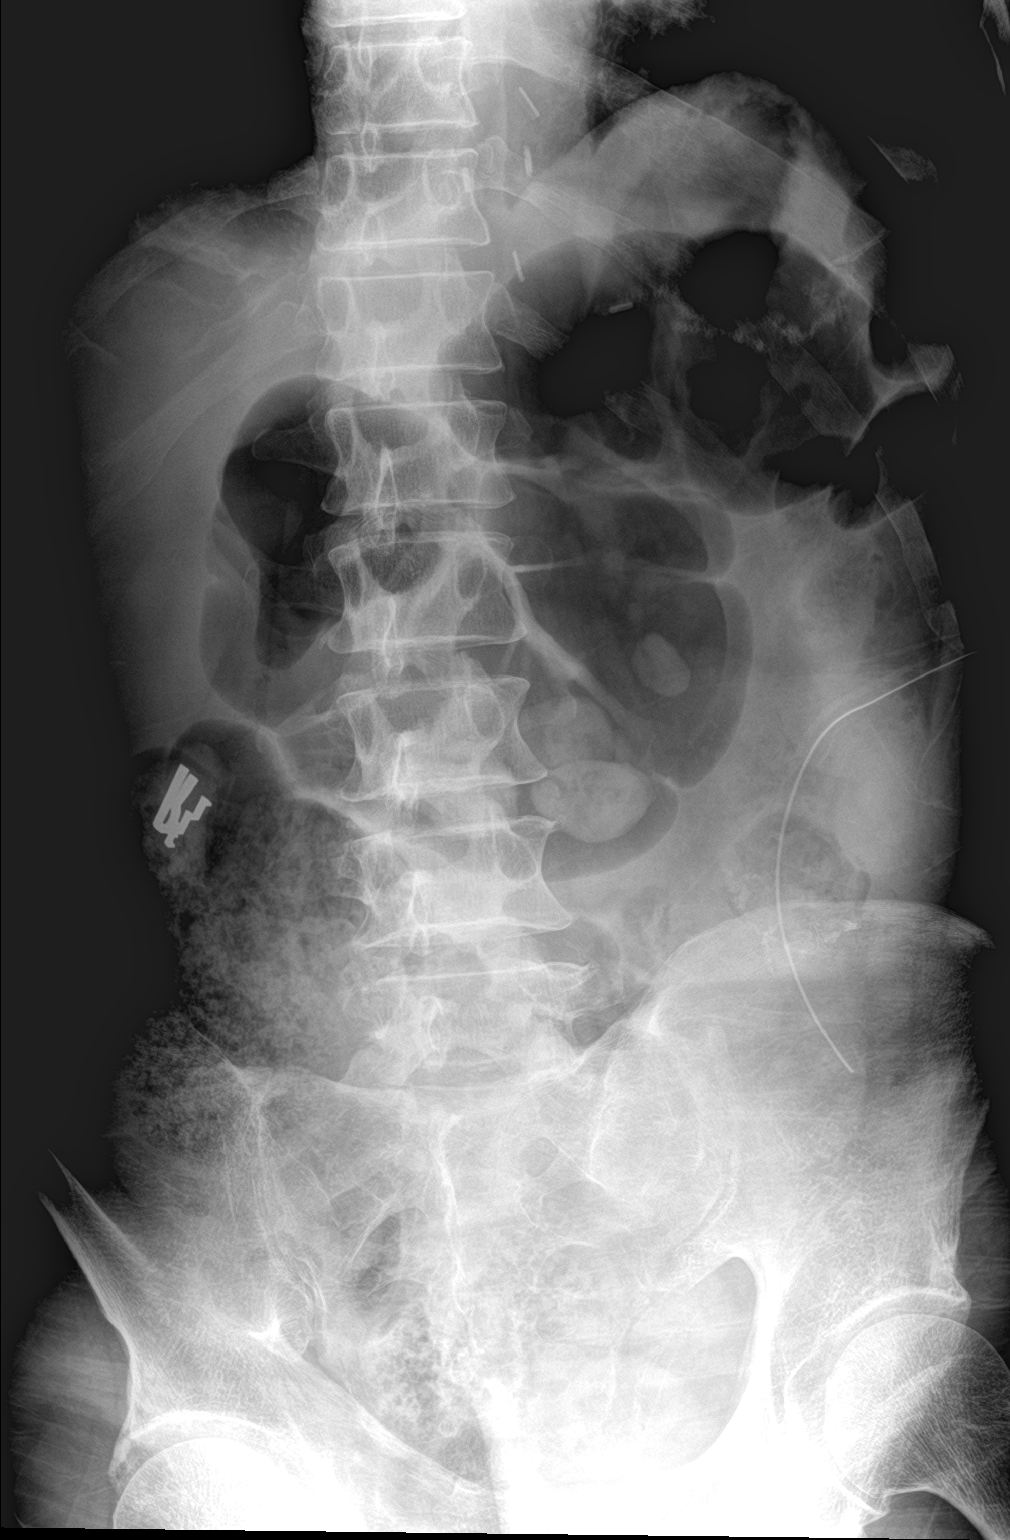

[chest ap]
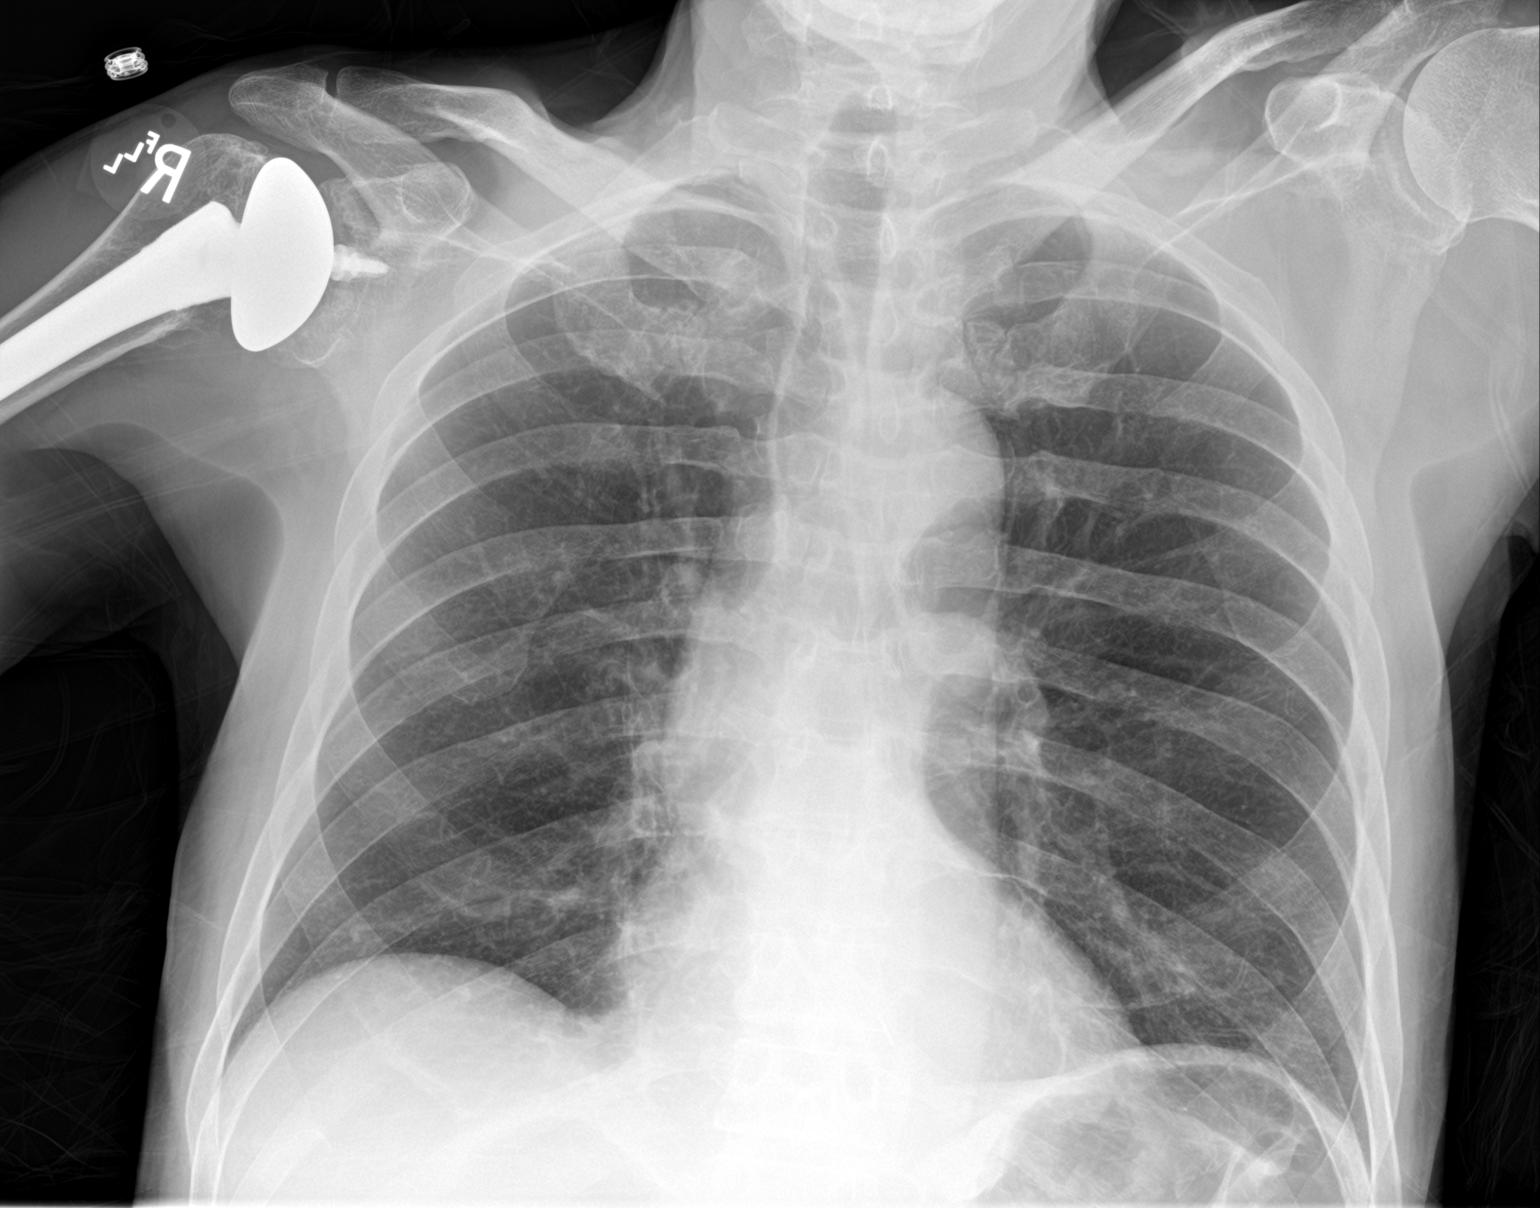

[3 of 3 positions shown; findings below may reference images not displayed]

FINDINGS: There is no evidence of dilated bowel loops or free intraperitoneal
air. No radiopaque calculi or other significant radiographic
abnormality is seen. Left lateral abdominal drainage catheter. Large
amount of stool in the ascending colon. Heart size and mediastinal
contours are within normal limits. Both lungs are clear. Right
shoulder arthroplasty.
IMPRESSION: Large amount of stool in the ascending colon. No acute
cardiopulmonary disease.

## 2017-05-31 IMAGING — CT CT ABD-PELV W/ CM
2 of 6 series · 13 of 46 positions shown, 15 images · IV contrast (iopamidol)
Comparison: None.

ADDENDUM:
Pancreatitis cannot be excluded on this study. Portal vein and
superior mesenteric vein are patent. Celiac axis and SMA are patent.
CLINICAL DATA: Constant abdominal pain since yesterday. history of
gastric ulcers refractory to medical treatment with PPIs who
developed a gastric outlet obstruction secondary to an enlarging
prepyloric ulcer and worsening pyloric stricture. Patient underwent
laparoscopic antrectomy, truncal vagaotomy and feeding Dalel Sama tube
placement with small bowel resection on 01/17/17 at [REDACTED].Per [REDACTED],
pathology on the small-bowel resected during surgery demonstrated
Crohn disease.

EXAM:
CT ABDOMEN AND PELVIS WITH CONTRAST
TECHNIQUE: Multidetector CT imaging of the abdomen and pelvis was performed
using the standard protocol following bolus administration of
intravenous contrast.
CONTRAST:  100mL [id number] IOPAMIDOL ([id number]) INJECTION 61%

[Series 2: axial st · axial · 0.73mm/px · z∈[+747,+1172]mm · 10 of 103 slices shown, 12 images]
[im 9/103  soft-tissue]
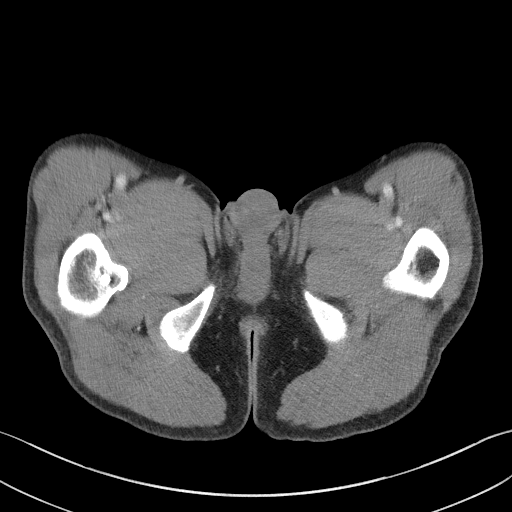
[im 9/103  bone]
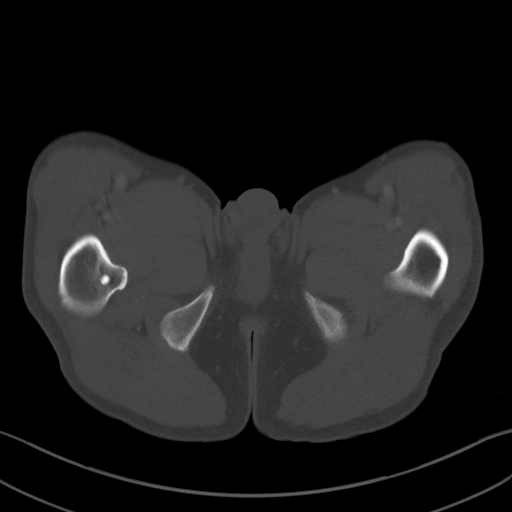
[im 18/103  soft-tissue]
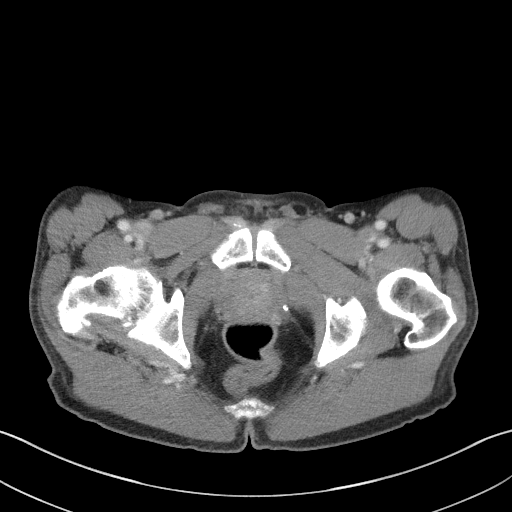
[im 26/103  soft-tissue]
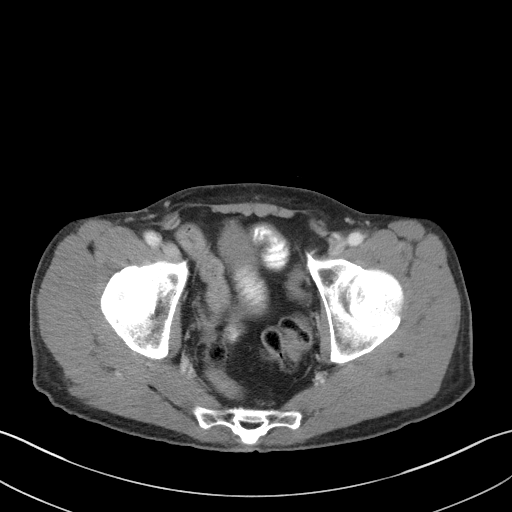
[im 35/103  soft-tissue]
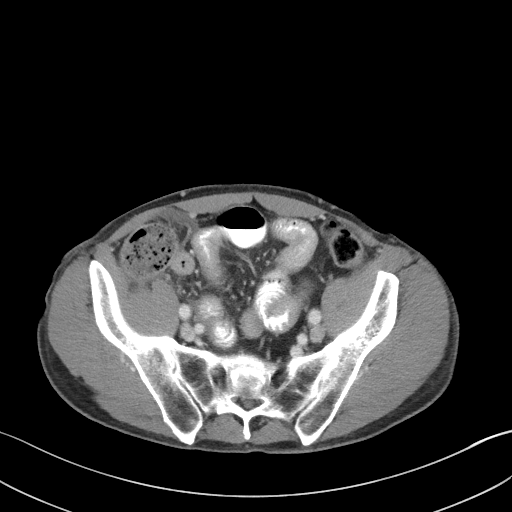
[im 43/103  soft-tissue]
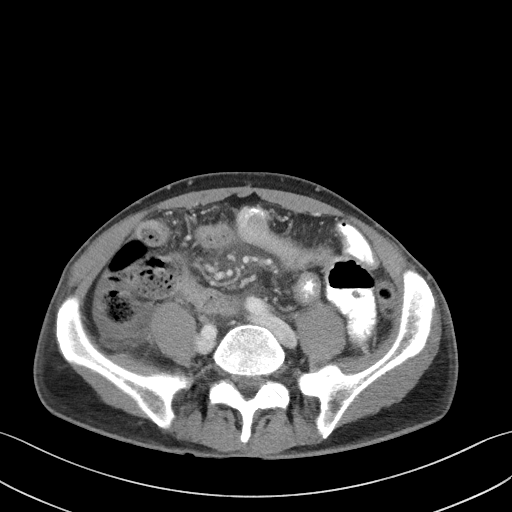
[im 60/103  soft-tissue]
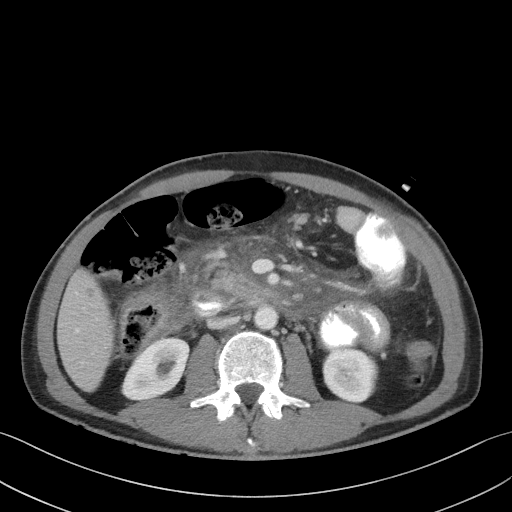
[im 69/103  soft-tissue]
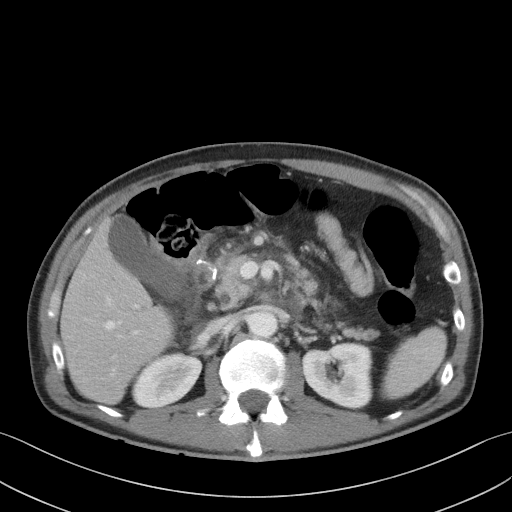
[im 77/103  soft-tissue]
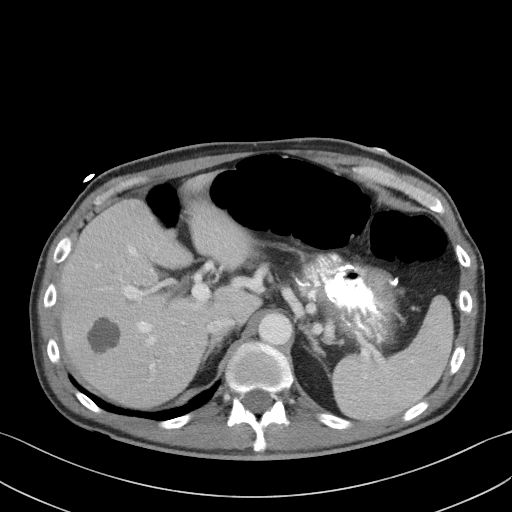
[im 86/103  soft-tissue]
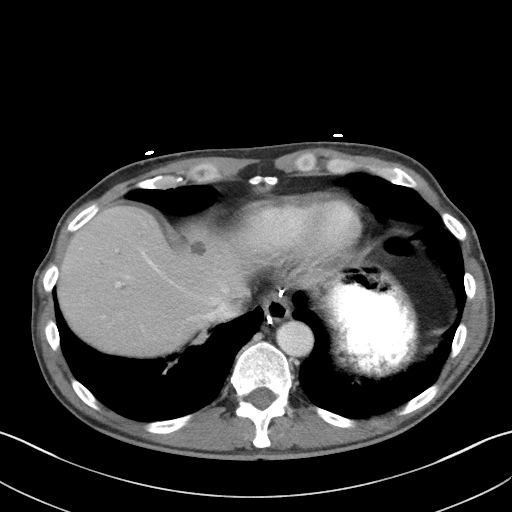
[im 86/103  bone]
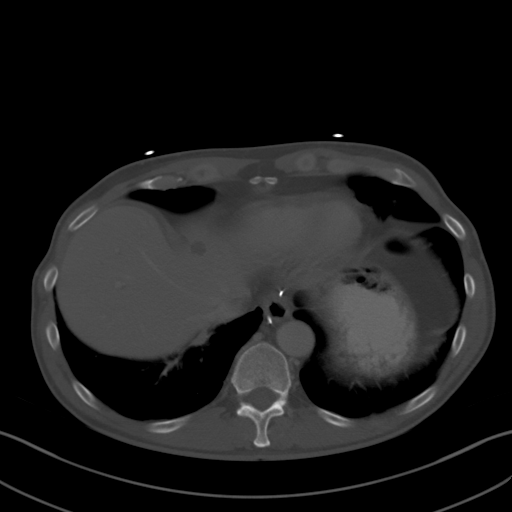
[im 94/103  soft-tissue]
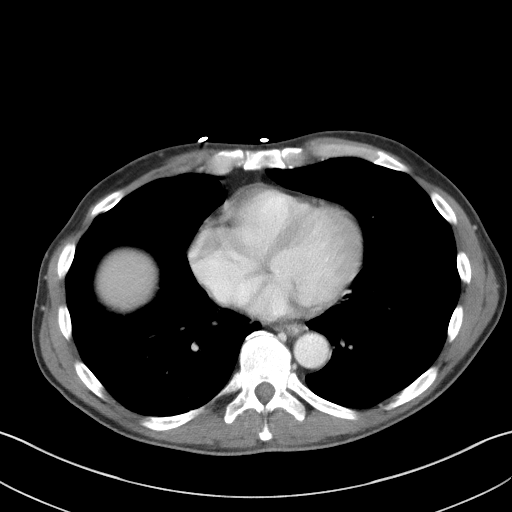

[Series 4: coronal st · coronal · 0.71mm/px · 3 of 79 slices shown]
[im 27/79  soft-tissue]
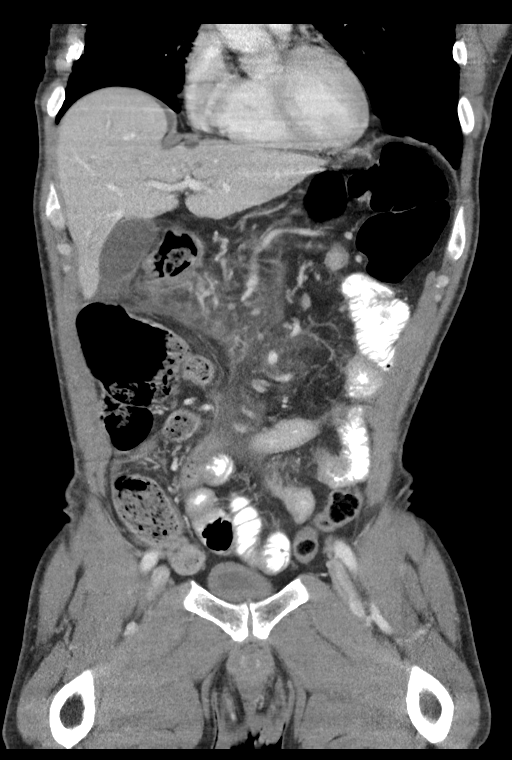
[im 35/79  soft-tissue]
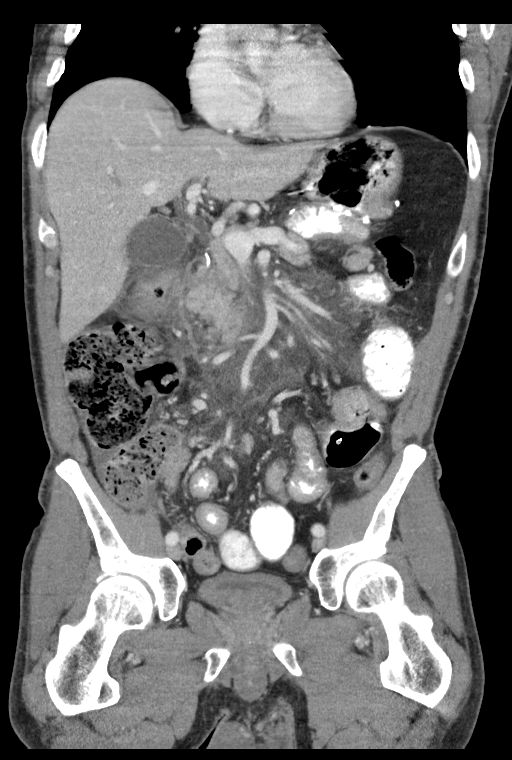
[im 44/79  soft-tissue]
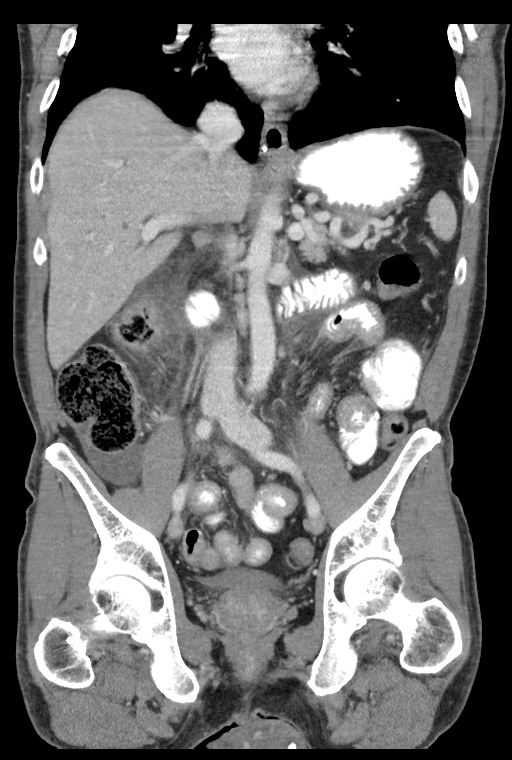

[13 of 46 positions shown; findings below may reference images not displayed]

FINDINGS: Lower chest: Peribronchovascular nodularity identified right lung
base.

Hepatobiliary: Multiple hepatic cysts measure up to 2.5 cm diameter.
Gallbladder is distended. No intrahepatic or extrahepatic biliary
dilation.

Pancreas: There is some fullness and ill definition in the uncinate
process of the pancreas. No dilatation of the main duct. Pancreatic
body and tail are unremarkable.

Spleen: No splenomegaly. No focal mass lesion.

Adrenals/Urinary Tract: No adrenal nodule or mass. 6 mm low-density
lesion interpolar right kidney is too small to characterize but
likely a cyst. 2.0 cm low-density lesion interpolar left kidney is
compatible with a cyst. No evidence for hydroureter. The urinary
bladder appears normal for the degree of distention.

Stomach/Bowel: Patient is status post distal gastrectomy. Oral
contrast material is seen in the stomach and in the efferent limb.
Surgically placed J-tube is visualized and the lumen around the
J-tube bulb is opacified with retrograde reflux of contrast material
into the duodenal stump. There is no evidence for small bowel
obstruction. Circumferential wall thickening noted in some segments
of small bowel may be related to underdistention although active
inflammation cannot be excluded. There is a loop of small bowel in
the right pelvis (image 63 series 2) that demonstrates fecalization
of enteric contents. This loop is approximately 20-30 cm proximal to
the ileocecal bowel. The terminal ileum is unremarkable. The
appendix is not visualized. Cecum has normal imaging features. There
is a segment of ascending colon (see images 36 - 44 of series 2)
that has circumferential wall thickening and luminal narrowing.
There may be some hyperenhancement in this segment of the ascending
colon. Transverse colon distal to the abnormal segment is normal in
appearance and well distended with gas and stool. Left colon
unremarkable.

Vascular/Lymphatic: No abdominal aortic aneurysm. No abdominal
aortic atherosclerotic calcification. There is no gastrohepatic or
hepatoduodenal ligament lymphadenopathy. No intraperitoneal or
retroperitoneal lymphadenopathy. There is fairly marked edema in the
root of the small bowel mesentery which tracks centrally and appears
to have some related edema/ fluid in the anterior pararenal space
around the pancreatic head and duodenum. The edema/ fluid also
tracks down around the ascending colon.

Reproductive: Prostate gland appears mildly enlarged.

Other: There is some minimal fluid around the liver and in the right
para colic gutter. No substantial fluid in the anatomic pelvis.

Musculoskeletal: Bone windows reveal no worrisome lytic or sclerotic
osseous lesions.
IMPRESSION: 1. Circumferential wall thickening and pericolonic edema identified
in the region of the mid ascending colon.
2. Mildly distended loop of small bowel in the right abdomen with
fecalization of contents, suggesting stasis. A component of
inflammatory stricturing distal to this region is a consideration,
There are no features of overt small bowel obstruction on the
current study.
3. Fairly extensive edema/inflammation identified in the small bowel
mesenteric the upper abdomen which tracks centrally along the
mesenteric vessels. There also appears to be edema/inflammation in
the anterior pararenal space around the head of the pancreas and
duodenum stump.
4. No evidence for intraperitoneal free air. There is no evidence of
contrast extravasation in the region of the gastrojejunostomy,
duodenal stump, J-tube, or Roux anastomosis to suggest leak. No CT
features to suggest abscess.
5. Small volume fluid identified around the liver and in the right
para colic gutter. No free fluid identified in the anatomic pelvis.
